# Patient Record
Sex: Female | Born: 1990 | Race: White | Hispanic: No | Smoking: Never smoker
Health system: Southern US, Community
[De-identification: ages and names within clinical notes are randomized; demographics above are authoritative.]

## PROBLEM LIST (undated history)

## (undated) ENCOUNTER — Inpatient Hospital Stay (HOSPITAL_COMMUNITY): Payer: Self-pay

## (undated) ENCOUNTER — Emergency Department (HOSPITAL_COMMUNITY): Admission: EM | Payer: Medicaid Other | Source: Home / Self Care

## (undated) DIAGNOSIS — I10 Essential (primary) hypertension: Secondary | ICD-10-CM

## (undated) DIAGNOSIS — F53 Postpartum depression: Secondary | ICD-10-CM

## (undated) DIAGNOSIS — Z9889 Other specified postprocedural states: Secondary | ICD-10-CM

## (undated) DIAGNOSIS — R51 Headache: Secondary | ICD-10-CM

## (undated) DIAGNOSIS — N390 Urinary tract infection, site not specified: Secondary | ICD-10-CM

## (undated) DIAGNOSIS — O99345 Other mental disorders complicating the puerperium: Secondary | ICD-10-CM

## (undated) DIAGNOSIS — L309 Dermatitis, unspecified: Secondary | ICD-10-CM

## (undated) DIAGNOSIS — R87629 Unspecified abnormal cytological findings in specimens from vagina: Secondary | ICD-10-CM

## (undated) HISTORY — PX: NO PAST SURGERIES: SHX2092

## (undated) HISTORY — DX: Unspecified abnormal cytological findings in specimens from vagina: R87.629

## (undated) HISTORY — DX: Other mental disorders complicating the puerperium: O99.345

## (undated) HISTORY — DX: Essential (primary) hypertension: I10

## (undated) HISTORY — DX: Postpartum depression: F53.0

---
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
## 2013-03-02 ENCOUNTER — Other Ambulatory Visit: Payer: Self-pay | Admitting: Advanced Practice Midwife

## 2014-01-28 ENCOUNTER — Encounter (HOSPITAL_COMMUNITY): Payer: Self-pay | Admitting: *Deleted

## 2016-03-29 NOTE — L&D Delivery Note (Signed)
Delivery Note Progressed quickly to complete dilation.  Pushed well  At 9:20 PM a viable and healthy female was delivered via Vaginal, Spontaneous (Presentation: OA  ).  APGAR: , ; weight  .   Placenta status: spontaneous and grossly intact with 3 vessel Cord:  with the following complications: none  Anesthesia:  epidural Episiotomy: None Lacerations:  none Suture Repair: none Est. Blood Loss (mL):  100  Mom to postpartum.  Baby to Couplet care / Skin to Skin.  Wynelle BourgeoisMarie Williams 02/12/2017, 10:45 PM  Please schedule this patient for PP visit in: 4 weeks High risk pregnancy complicated by: ? hypertension vs error with home cuff, also baby had bilateral choroid plexus cysts, GBS + Delivery mode:  SVD Anticipated Birth Control:  Nexplanon PP Procedures needed: none  Schedule Integrated BH visit: no Provider: Any provider

## 2016-07-03 ENCOUNTER — Other Ambulatory Visit (HOSPITAL_COMMUNITY): Payer: Self-pay | Admitting: Lab

## 2016-07-03 ENCOUNTER — Other Ambulatory Visit (HOSPITAL_COMMUNITY): Payer: Self-pay | Admitting: Obstetrics & Gynecology

## 2016-07-03 ENCOUNTER — Other Ambulatory Visit (HOSPITAL_COMMUNITY)
Admission: RE | Admit: 2016-07-03 | Discharge: 2016-07-03 | Disposition: A | Discharge status: Home / Self Care | Payer: Medicaid Other | Source: Ambulatory Visit | Attending: Obstetrics & Gynecology | Admitting: Obstetrics & Gynecology

## 2016-07-03 DIAGNOSIS — Z3490 Encounter for supervision of normal pregnancy, unspecified, unspecified trimester: Secondary | ICD-10-CM | POA: Diagnosis not present

## 2016-07-03 DIAGNOSIS — O3680X0 Pregnancy with inconclusive fetal viability, not applicable or unspecified: Secondary | ICD-10-CM

## 2016-07-03 LAB — HCG, QUANTITATIVE, PREGNANCY: hCG, Beta Chain, Quant, S: 2053 m[IU]/mL — ABNORMAL HIGH (ref <5)

## 2016-07-23 LAB — OB RESULTS CONSOLE HEPATITIS B SURFACE ANTIGEN: Hepatitis B Surface Ag: NEGATIVE

## 2016-07-23 LAB — OB RESULTS CONSOLE GBS: GBS: POSITIVE

## 2016-07-23 LAB — OB RESULTS CONSOLE RUBELLA ANTIBODY, IGM: Rubella: IMMUNE

## 2016-07-23 LAB — OB RESULTS CONSOLE GC/CHLAMYDIA
Chlamydia: NEGATIVE
Gonorrhea: NEGATIVE

## 2016-07-23 LAB — OB RESULTS CONSOLE HIV ANTIBODY (ROUTINE TESTING): HIV: NONREACTIVE

## 2016-07-23 LAB — OB RESULTS CONSOLE RPR: RPR: NONREACTIVE

## 2016-09-22 ENCOUNTER — Ambulatory Visit (INDEPENDENT_AMBULATORY_CARE_PROVIDER_SITE_OTHER): Payer: 59 | Admitting: Advanced Practice Midwife

## 2016-09-22 ENCOUNTER — Telehealth: Payer: Self-pay | Admitting: Obstetrics & Gynecology

## 2016-09-22 ENCOUNTER — Encounter: Payer: Self-pay | Admitting: *Deleted

## 2016-09-22 ENCOUNTER — Encounter (INDEPENDENT_AMBULATORY_CARE_PROVIDER_SITE_OTHER): Payer: Self-pay

## 2016-09-22 ENCOUNTER — Encounter: Payer: Self-pay | Admitting: Advanced Practice Midwife

## 2016-09-22 VITALS — BP 118/78 | HR 82 | Wt 231.5 lb

## 2016-09-22 DIAGNOSIS — Z8659 Personal history of other mental and behavioral disorders: Secondary | ICD-10-CM | POA: Insufficient documentation

## 2016-09-22 DIAGNOSIS — Z3482 Encounter for supervision of other normal pregnancy, second trimester: Secondary | ICD-10-CM

## 2016-09-22 DIAGNOSIS — Z1389 Encounter for screening for other disorder: Secondary | ICD-10-CM

## 2016-09-22 DIAGNOSIS — Z363 Encounter for antenatal screening for malformations: Secondary | ICD-10-CM

## 2016-09-22 DIAGNOSIS — Z331 Pregnant state, incidental: Secondary | ICD-10-CM

## 2016-09-22 DIAGNOSIS — Z3490 Encounter for supervision of normal pregnancy, unspecified, unspecified trimester: Secondary | ICD-10-CM | POA: Insufficient documentation

## 2016-09-22 DIAGNOSIS — Z8759 Personal history of other complications of pregnancy, childbirth and the puerperium: Principal | ICD-10-CM

## 2016-09-22 LAB — POCT URINALYSIS DIPSTICK
Blood, UA: NEGATIVE
Glucose, UA: NEGATIVE
Ketones, UA: NEGATIVE
Leukocytes, UA: NEGATIVE
Nitrite, UA: NEGATIVE
Protein, UA: NEGATIVE

## 2016-09-22 MED ORDER — ONDANSETRON HCL 4 MG PO TABS: 4.0000 mg | ORAL_TABLET | Freq: Three times a day (TID) | ORAL | 1 refills | Status: DC | PRN

## 2016-09-22 NOTE — Patient Instructions (Signed)
Subjective:        First Trimester of Pregnancy The first trimester of pregnancy is from week 1 until the end of week 12 (months 1 through 3). A week after a sperm fertilizes an egg, the egg will implant on the wall of the uterus. This embryo will begin to develop into a baby. Genes from you and your partner are forming the baby. The female genes determine whether the baby is a boy or a girl. At 6-8 weeks, the eyes and face are formed, and the heartbeat can be seen on ultrasound. At the end of 12 weeks, all the baby's organs are formed.  Now that you are pregnant, you will want to do everything you can to have a healthy baby. Two of the most important things are to get good prenatal care and to follow your health care provider's instructions. Prenatal care is all the medical care you receive before the baby's birth. This care will help prevent, find, and treat any problems during the pregnancy and childbirth. BODY CHANGES Your body goes through many changes during pregnancy. The changes vary from woman to woman.   You may gain or lose a couple of pounds at first.  You may feel sick to your stomach (nauseous) and throw up (vomit). If the vomiting is uncontrollable, call your health care provider.  You may tire easily.  You may develop headaches that can be relieved by medicines approved by your health care provider.  You may urinate more often. Painful urination may mean you have a bladder infection.  You may develop heartburn as a result of your pregnancy.  You may develop constipation because certain hormones are causing the muscles that push waste through your intestines to slow down.  You may develop hemorrhoids or swollen, bulging veins (varicose veins).  Your breasts may begin to grow larger and become tender. Your nipples may stick out more, and the tissue that surrounds them (areola) may become darker.  Your gums may bleed and may be sensitive to brushing and flossing.  Dark  spots or blotches (chloasma, mask of pregnancy) may develop on your face. This will likely fade after the baby is born.  Your menstrual periods will stop.  You may have a loss of appetite.  You may develop cravings for certain kinds of food.  You may have changes in your emotions from day to day, such as being excited to be pregnant or being concerned that something may go wrong with the pregnancy and baby.  You may have more vivid and strange dreams.  You may have changes in your hair. These can include thickening of your hair, rapid growth, and changes in texture. Some women also have hair loss during or after pregnancy, or hair that feels dry or thin. Your hair will most likely return to normal after your baby is born. WHAT TO EXPECT AT YOUR PRENATAL VISITS During a routine prenatal visit:  You will be weighed to make sure you and the baby are growing normally.  Your blood pressure will be taken.  Your abdomen will be measured to track your baby's growth.  The fetal heartbeat will be listened to starting around week 10 or 12 of your pregnancy.  Test results from any previous visits will be discussed. Your health care provider may ask you:  How you are feeling.  If you are feeling the baby move.  If you have had any abnormal symptoms, such as leaking fluid, bleeding, severe headaches, or abdominal cramping.  If you have any questions. Other tests that may be performed during your first trimester include:  Blood tests to find your blood type and to check for the presence of any previous infections. They will also be used to check for low iron levels (anemia) and Rh antibodies. Later in the pregnancy, blood tests for diabetes will be done along with other tests if problems develop.  Urine tests to check for infections, diabetes, or protein in the urine.  An ultrasound to confirm the proper growth and development of the baby.  An amniocentesis to check for possible genetic  problems.  Fetal screens for spina bifida and Down syndrome.  You may need other tests to make sure you and the baby are doing well. HOME CARE INSTRUCTIONS  Medicines  Follow your health care provider's instructions regarding medicine use. Specific medicines may be either safe or unsafe to take during pregnancy.  Take your prenatal vitamins as directed.  If you develop constipation, try taking a stool softener if your health care provider approves. Diet  Eat regular, well-balanced meals. Choose a variety of foods, such as meat or vegetable-based protein, fish, milk and low-fat dairy products, vegetables, fruits, and whole grain breads and cereals. Your health care provider will help you determine the amount of weight gain that is right for you.  Avoid raw meat and uncooked cheese. These carry germs that can cause birth defects in the baby.  Eating four or five small meals rather than three large meals a day may help relieve nausea and vomiting. If you start to feel nauseous, eating a few soda crackers can be helpful. Drinking liquids between meals instead of during meals also seems to help nausea and vomiting.  If you develop constipation, eat more high-fiber foods, such as fresh vegetables or fruit and whole grains. Drink enough fluids to keep your urine clear or pale yellow. Activity and Exercise  Exercise only as directed by your health care provider. Exercising will help you:  Control your weight.  Stay in shape.  Be prepared for labor and delivery.  Experiencing pain or cramping in the lower abdomen or low back is a good sign that you should stop exercising. Check with your health care provider before continuing normal exercises.  Try to avoid standing for long periods of time. Move your legs often if you must stand in one place for a long time.  Avoid heavy lifting.  Wear low-heeled shoes, and practice good posture.  You may continue to have sex unless your health care  provider directs you otherwise. Relief of Pain or Discomfort  Wear a good support bra for breast tenderness.   Take warm sitz baths to soothe any pain or discomfort caused by hemorrhoids. Use hemorrhoid cream if your health care provider approves.   Rest with your legs elevated if you have leg cramps or low back pain.  If you develop varicose veins in your legs, wear support hose. Elevate your feet for 15 minutes, 3-4 times a day. Limit salt in your diet. Prenatal Care  Schedule your prenatal visits by the twelfth week of pregnancy. They are usually scheduled monthly at first, then more often in the last 2 months before delivery.  Write down your questions. Take them to your prenatal visits.  Keep all your prenatal visits as directed by your health care provider. Safety  Wear your seat belt at all times when driving.  Make a list of emergency phone numbers, including numbers for family, friends, the hospital,  and police and fire departments. General Tips  Ask your health care provider for a referral to a local prenatal education class. Begin classes no later than at the beginning of month 6 of your pregnancy.  Ask for help if you have counseling or nutritional needs during pregnancy. Your health care provider can offer advice or refer you to specialists for help with various needs.  Do not use hot tubs, steam rooms, or saunas.  Do not douche or use tampons or scented sanitary pads.  Do not cross your legs for long periods of time.  Avoid cat litter boxes and soil used by cats. These carry germs that can cause birth defects in the baby and possibly loss of the fetus by miscarriage or stillbirth.  Avoid all smoking, herbs, alcohol, and medicines not prescribed by your health care provider. Chemicals in these affect the formation and growth of the baby.  Schedule a dentist appointment. At home, brush your teeth with a soft toothbrush and be gentle when you floss. SEEK MEDICAL  CARE IF:   You have dizziness.  You have mild pelvic cramps, pelvic pressure, or nagging pain in the abdominal area.  You have persistent nausea, vomiting, or diarrhea.  You have a bad smelling vaginal discharge.  You have pain with urination.  You notice increased swelling in your face, hands, legs, or ankles. SEEK IMMEDIATE MEDICAL CARE IF:   You have a fever.  You are leaking fluid from your vagina.  You have spotting or bleeding from your vagina.  You have severe abdominal cramping or pain.  You have rapid weight gain or loss.  You vomit blood or material that looks like coffee grounds.  You are exposed to [place] measles and have never had them.  You are exposed to fifth disease or chickenpox.  You develop a severe headache.  You have shortness of breath.  You have any kind of trauma, such as from a fall or a car accident. Document Released: 03/09/2001 Document Revised: 07/30/2013 Document Reviewed: 01/23/2013 [facility] Patient Information 2015 [place], [place]. This information is not intended to replace advice given to you by your health care provider. Make sure you discuss any questions you have with your health care provider.   Nausea & Vomiting  Have saltine crackers or pretzels by your bed and eat a few bites before you raise your head out of bed in the morning  Eat small frequent meals throughout the day instead of large meals  Drink plenty of fluids throughout the day to stay hydrated, just don't drink a lot of fluids with your meals.  This can make your stomach fill up faster making you feel sick  Do not brush your teeth right after you eat  Products with real ginger are good for nausea, like ginger ale and ginger hard candy Make sure it says made with real ginger!  Sucking on sour candy like lemon heads is also good for nausea  If your prenatal vitamins make you nauseated, take them at night so you will sleep through the nausea  Sea Bands  If you  feel like you need medicine for the nausea & vomiting please let us know  If you are unable to keep any fluids or food down please let us know   Constipation  Drink plenty of fluid, preferably water, throughout the day  Eat foods high in fiber such as fruits, vegetables, and grains  Exercise, such as walking, is a good way to keep your bowels regular  Drink warm fluids, especially warm prune juice, or decaf coffee  Eat a 1/2 cup of real oatmeal (not instant), 1/2 cup applesauce, and 1/2-1 cup warm prune juice every day  If needed, you may take Colace (docusate sodium) stool softener once or twice a day to help keep the stool soft. If you are pregnant, wait until you are out of your first trimester (12-14 weeks of pregnancy)  If you still are having problems with constipation, you may take Miralax once daily as needed to help keep your bowels regular.  If you are pregnant, wait until you are out of your first trimester (12-14 weeks of pregnancy)  Safe Medications in Pregnancy   Acne: Benzoyl Peroxide Salicylic Acid  Backache/Headache: Tylenol: 2 regular strength every 4 hours OR              2 Extra strength every 6 hours  Colds/Coughs/Allergies: Benadryl (alcohol free) 25 mg every 6 hours as needed Breath right strips Claritin Cepacol throat lozenges Chloraseptic throat spray Cold-Eeze- up to three times per day Cough drops, alcohol free Flonase (by prescription only) Guaifenesin Mucinex Robitussin DM (plain only, alcohol free) Saline nasal spray/drops Sudafed (pseudoephedrine) & Actifed ** use only after [redacted] weeks gestation and if you do not have high blood pressure Tylenol Vicks Vaporub Zinc lozenges Zyrtec   Constipation: Colace Ducolax suppositories Fleet enema Glycerin suppositories Metamucil Milk of magnesia Miralax Senokot Smooth move tea  Diarrhea: Kaopectate Imodium A-D  *NO pepto Bismol  Hemorrhoids: Anusol Anusol HC Preparation  H Tucks  Indigestion: Tums Maalox Mylanta Zantac  Pepcid  Insomnia: Benadryl (alcohol free)  every 6 hours as needed Tylenol PM Unisom, no Gelcaps  Leg Cramps: Tums MagGel  Nausea/Vomiting:  Bonine Dramamine Emetrol Ginger extract Sea bands Meclizine  Nausea medication to take during pregnancy:  Unisom (doxylamine succinate 25 mg tablets) Take one tablet daily at bedtime. If symptoms are not adequately controlled, the dose can be increased to a maximum recommended dose of two tablets daily (1/2 tablet in the morning, 1/2 tablet mid-afternoon and one at bedtime). Vitamin B6  tablets. Take one tablet twice a day (up to 200 mg per day).  Skin Rashes: Aveeno products Benadryl cream or  every 6 hours as needed Calamine Lotion 1% cortisone cream  Yeast infection: Gyne-lotrimin 7 Monistat 7   **If taking multiple medications, please check labels to avoid duplicating the same active ingredients **take medication as directed on the label ** Do not exceed 4000 mg of tylenol in 24 hours **Do not take medications that contain aspirin or ibuprofen

## 2016-09-22 NOTE — Telephone Encounter (Signed)
Patient called to schedule new ob appt with us today.

## 2016-09-22 NOTE — Progress Notes (Signed)
.    Subjective:    Shelly Hancock is a G2P1001 7217w4d being seen today for her first obstetrical visit.  [place] a [place] [employer] [place]/t having medicaid as her secondary insurance. Her obstetrical history is significant for Term SVD.  Pregnancy history fully reviewed.  Patient reports nausea.  Vitals:   09/22/16 1540  BP: 118/78  Pulse: 82  Weight: 231 lb 8 oz (105 kg)    HISTORY: OB History  Gravida Para Term Preterm AB Living  2 1 1  0 0 1  SAB TAB Ectopic Multiple Live Births  0 0 0 0 1    # Outcome Date GA Lbr Len/2nd Weight Sex Delivery Anes PTL Lv  2 Current           1 Term 11/02/11 8068w1d 14:15 / 01:00 7 lb 3.9 oz (3.286 kg) M Vag-Spont EPI  LIV     Past Medical History:  Diagnosis Date  . Eczema   . Headache(784.0)   . Hypertension   . Postpartum depression   . Urinary tract infection   . Vaginal Pap smear, abnormal    Past Surgical History:  Procedure Laterality Date  . NO PAST SURGERIES     Family History  Problem Relation Age of Onset  . Heart attack Paternal Grandfather   . Diabetes Paternal Grandmother   . Cancer Maternal Grandmother        colon  . Heart attack Father   . Hypertension Mother   . Cancer Mother        uterine cancer  . Cancer Maternal Aunt        skin  . Cancer Maternal Uncle        lung cancer  . Other Neg Hx      Exam                                      System:     Skin: normal coloration and turgor, no rashes    Neurologic: oriented, normal, normal mood   Extremities: normal strength, tone, and muscle mass   HEENT PERRLA   Mouth/Teeth mucous membranes moist, normal dentition   Neck supple and no masses   Cardiovascular: regular rate and rhythm   Respiratory:  appears well, vitals normal, no respiratory distress, acyanotic   Abdomen: soft, non-tender;  FHR: 157          Assessment:    Pregnancy: G2P1001 Patient Active Problem List   Diagnosis Date Noted  . History of postpartum depression  09/22/2016  . Supervision of normal pregnancy 09/22/2016        Plan:    . Continue prenatal vitamins  Problem list reviewed and updated  rx zofran  Reviewed recommended weight gain based on pre-gravid BMI  Encouraged well-balanced diet Genetic Screening discussed Integrated Screen: results reviewed.  Ultrasound discussed; fetal survey: requested.  Return in about 3 weeks (around 10/13/2016) for LROB, ZO:[id number]:Anatomy.  CRESENZO-DISHMAN,Glorya Bartley 09/23/2016

## 2016-10-15 ENCOUNTER — Ambulatory Visit (INDEPENDENT_AMBULATORY_CARE_PROVIDER_SITE_OTHER): Payer: 59

## 2016-10-15 ENCOUNTER — Ambulatory Visit (INDEPENDENT_AMBULATORY_CARE_PROVIDER_SITE_OTHER): Payer: 59 | Admitting: Women's Health

## 2016-10-15 ENCOUNTER — Encounter: Payer: Self-pay | Admitting: Women's Health

## 2016-10-15 ENCOUNTER — Other Ambulatory Visit: Payer: Self-pay | Admitting: Women's Health

## 2016-10-15 VITALS — BP 118/62 | HR 85 | Wt 231.0 lb

## 2016-10-15 DIAGNOSIS — IMO0002 Reserved for concepts with insufficient information to code with codable children: Secondary | ICD-10-CM | POA: Insufficient documentation

## 2016-10-15 DIAGNOSIS — Z363 Encounter for antenatal screening for malformations: Secondary | ICD-10-CM

## 2016-10-15 DIAGNOSIS — Z3A19 19 weeks gestation of pregnancy: Secondary | ICD-10-CM

## 2016-10-15 DIAGNOSIS — O09892 Supervision of other high risk pregnancies, second trimester: Secondary | ICD-10-CM

## 2016-10-15 DIAGNOSIS — Z3482 Encounter for supervision of other normal pregnancy, second trimester: Secondary | ICD-10-CM

## 2016-10-15 DIAGNOSIS — R8271 Bacteriuria: Secondary | ICD-10-CM

## 2016-10-15 DIAGNOSIS — O350XX0 Maternal care for (suspected) central nervous system malformation in fetus, not applicable or unspecified: Secondary | ICD-10-CM

## 2016-10-15 DIAGNOSIS — Z1389 Encounter for screening for other disorder: Secondary | ICD-10-CM

## 2016-10-15 DIAGNOSIS — IMO0001 Reserved for inherently not codable concepts without codable children: Secondary | ICD-10-CM

## 2016-10-15 DIAGNOSIS — Z3402 Encounter for supervision of normal first pregnancy, second trimester: Secondary | ICD-10-CM

## 2016-10-15 DIAGNOSIS — Z331 Pregnant state, incidental: Secondary | ICD-10-CM

## 2016-10-15 LAB — POCT URINALYSIS DIPSTICK
Blood, UA: NEGATIVE
Glucose, UA: NEGATIVE
Ketones, UA: NEGATIVE
Nitrite, UA: NEGATIVE
Protein, UA: NEGATIVE

## 2016-10-15 NOTE — Progress Notes (Signed)
Low-risk OB appointment G2P1001 2261w6d Estimated Date of Delivery: 03/05/17 BP 118/62   Pulse 85   Wt 231 lb (104.8 kg)   LMP 05/29/2016   BMI 36.18 kg/m   BP, weight, and urine reviewed.  Refer to obstetrical flow sheet for FH & FHR.  Reports good fm.  Denies regular uc's, lof, vb, or uti s/s. No complaints.  Reviewed today's anatomy u/s, bilateral cp cysts, otherwise normal. Had 1st IT/NT at [facility] prior to transferring here, never had 2nd IT, unsure of which lab they use w/ [facility] to get 2nd IT today-discussed w/ [facility], will get AFP today if pt desires, which she does.  Plan:  Continue routine obstetrical care  F/U in 4wks for OB appointment  AFP, urine cx poc today

## 2016-10-15 NOTE — Patient Instructions (Signed)

## 2016-10-15 NOTE — Progress Notes (Signed)
US 19+6 wks,cephalic,cx 5 cm,normal ovaries bilat,post pl gr 0,svp of fluid 4.5 cm,bilat choroid plexus cysts,left 4.6 x 2.7 x 3.4 mm,right 4.4 x 3.3 x 4.6 mm,fhr 157 bpm,EFW 342 g,anatomy complete

## 2016-10-16 LAB — AFP TUMOR MARKER: AFP, Serum, Tumor Marker: 62.6 ng/mL — ABNORMAL HIGH (ref 0.0–8.3)

## 2016-10-17 LAB — URINE CULTURE

## 2016-10-21 ENCOUNTER — Other Ambulatory Visit: Payer: Self-pay | Admitting: Women's Health

## 2016-10-21 ENCOUNTER — Encounter: Payer: Self-pay | Admitting: Women's Health

## 2016-10-21 ENCOUNTER — Telehealth: Payer: Self-pay | Admitting: *Deleted

## 2016-10-21 MED ORDER — AMOXICILLIN 500 MG PO CAPS: 500.0000 mg | ORAL_CAPSULE | Freq: Two times a day (BID) | ORAL | 0 refills | Status: DC

## 2016-10-21 NOTE — Telephone Encounter (Signed)
Informed patient GBS still positive and prescription for antibiotic was sent to pharmacy. Advised to take all as directed. Verbalized understanding.

## 2016-10-26 ENCOUNTER — Encounter: Payer: Self-pay | Admitting: Women's Health

## 2016-11-01 ENCOUNTER — Encounter: Payer: Self-pay | Admitting: Women's Health

## 2016-11-05 ENCOUNTER — Telehealth: Payer: Self-pay | Admitting: *Deleted

## 2016-11-05 NOTE — Telephone Encounter (Signed)
Informed patient that AFP test was not recalculated and can only have AFP only for open spina bifida drawn before 23.6. States she lives 30 minutes away but will have drawn at that visit.

## 2016-11-08 ENCOUNTER — Other Ambulatory Visit: Payer: Self-pay | Admitting: Adult Health

## 2016-11-08 ENCOUNTER — Encounter: Payer: Self-pay | Admitting: Women's Health

## 2016-11-08 MED ORDER — ONDANSETRON HCL 4 MG PO TABS: ORAL_TABLET | ORAL | 1 refills | Status: DC

## 2016-11-08 NOTE — Progress Notes (Signed)
Refilled zofan

## 2016-11-09 ENCOUNTER — Encounter: Payer: Self-pay | Admitting: Women's Health

## 2016-11-10 ENCOUNTER — Encounter: Payer: Self-pay | Admitting: Women's Health

## 2016-11-12 ENCOUNTER — Encounter: Payer: Self-pay | Admitting: Obstetrics & Gynecology

## 2016-11-12 ENCOUNTER — Ambulatory Visit (INDEPENDENT_AMBULATORY_CARE_PROVIDER_SITE_OTHER): Payer: 59 | Admitting: Obstetrics & Gynecology

## 2016-11-12 VITALS — BP 130/70 | HR 86 | Wt 233.0 lb

## 2016-11-12 DIAGNOSIS — O350XX0 Maternal care for (suspected) central nervous system malformation in fetus, not applicable or unspecified: Secondary | ICD-10-CM

## 2016-11-12 DIAGNOSIS — Z3A23 23 weeks gestation of pregnancy: Secondary | ICD-10-CM

## 2016-11-12 DIAGNOSIS — Z331 Pregnant state, incidental: Secondary | ICD-10-CM

## 2016-11-12 DIAGNOSIS — Z3482 Encounter for supervision of other normal pregnancy, second trimester: Secondary | ICD-10-CM

## 2016-11-12 DIAGNOSIS — G930 Cerebral cysts: Secondary | ICD-10-CM

## 2016-11-12 DIAGNOSIS — Z1389 Encounter for screening for other disorder: Secondary | ICD-10-CM

## 2016-11-12 LAB — POCT URINALYSIS DIPSTICK
Blood, UA: NEGATIVE
Glucose, UA: NEGATIVE
Leukocytes, UA: NEGATIVE
Nitrite, UA: NEGATIVE

## 2016-11-12 MED ORDER — OMEPRAZOLE 20 MG PO CPDR: DELAYED_RELEASE_CAPSULE | ORAL | 6 refills | Status: DC

## 2016-11-12 NOTE — Progress Notes (Signed)
G2P1001 [redacted]w[redacted]d Estimated Date of Delivery: 03/05/17  Blood pressure 130/70, pulse 86, weight 233 lb (105.7 kg), last menstrual period 05/29/2016, unknown if currently breastfeeding.   BP weight and urine results all reviewed and noted.  Please refer to the obstetrical flow sheet for the fundal height and fetal heart rate documentation:  Patient reports good fetal movement, denies any bleeding and no rupture of membranes symptoms or regular contractions. Patient is without complaints. All questions were answered.  Orders Placed This Encounter  Procedures  . US OB Limited  . POCT urinalysis dipstick    Plan:  Continued routine obstetrical care, PN2 + sonogram next visit, recheck CP cysts  Return in about 4 weeks (around 12/10/2016) for repeat sonogram, PN2,, LROB.

## 2016-11-12 NOTE — Addendum Note (Signed)
Addended by: Lazaro Arms on: 11/12/2016 10:36 AM   Modules accepted: Orders

## 2016-11-15 ENCOUNTER — Encounter: Payer: Self-pay | Admitting: Women's Health

## 2016-11-15 ENCOUNTER — Telehealth: Payer: Self-pay | Admitting: *Deleted

## 2016-11-15 ENCOUNTER — Telehealth: Payer: Self-pay | Admitting: Women's Health

## 2016-11-15 DIAGNOSIS — Z3482 Encounter for supervision of other normal pregnancy, second trimester: Secondary | ICD-10-CM

## 2016-11-15 DIAGNOSIS — Z029 Encounter for administrative examinations, unspecified: Secondary | ICD-10-CM

## 2016-11-15 DIAGNOSIS — IMO0001 Reserved for inherently not codable concepts without codable children: Secondary | ICD-10-CM

## 2016-11-15 NOTE — Telephone Encounter (Signed)
Called pt, have been e-mailing through [employer], but easier to talk on phone to discuss. AFP tumor marker was drawn instead of AFP @ 20wks. Tish had called [facility] and gotten them to switch it, however about 2-3wks later we still didn't have results, so she called [facility] again and they said they had no record of her calling, and it was never switched to regular AFP. Discussed that the CP cysts on u/s can sometimes be seen w/ T18, however all other anatomy was normal. Offered Informaseq, pt declines any further genetic testing at this time.  She reports she is concerned about her bp. Her insurance company sent her a bp cuff, so she has been checking at home. Has been getting some elevated readings. Got 140/110 yesterday, however today was normal today when she checked it. Some dizziness. No headaches, visual changes, ruq/epigastric pain. States she feels completely fine today. BP was 130/70 here at her appt on Friday. Discussed she needs to be seen, she can not get here today. Switched to front to work-in this week for bp check. She is to bring her cuff with her so we can make sure it is accurate. Discussed pre-e s/s, reasons to seek care.  Cheral Marker, CNM, [facility] 11/15/2016 3:08 PM

## 2016-11-16 ENCOUNTER — Encounter: Payer: Self-pay | Admitting: Women's Health

## 2016-11-18 ENCOUNTER — Encounter: Payer: Self-pay | Admitting: Women's Health

## 2016-11-18 ENCOUNTER — Ambulatory Visit (INDEPENDENT_AMBULATORY_CARE_PROVIDER_SITE_OTHER): Payer: 59 | Admitting: Women's Health

## 2016-11-18 VITALS — BP 131/70 | HR 106 | Wt 232.0 lb

## 2016-11-18 DIAGNOSIS — Z3A24 24 weeks gestation of pregnancy: Secondary | ICD-10-CM

## 2016-11-18 DIAGNOSIS — O350XX0 Maternal care for (suspected) central nervous system malformation in fetus, not applicable or unspecified: Secondary | ICD-10-CM

## 2016-11-18 DIAGNOSIS — O132 Gestational [pregnancy-induced] hypertension without significant proteinuria, second trimester: Secondary | ICD-10-CM

## 2016-11-18 DIAGNOSIS — IMO0001 Reserved for inherently not codable concepts without codable children: Secondary | ICD-10-CM

## 2016-11-18 DIAGNOSIS — Z331 Pregnant state, incidental: Secondary | ICD-10-CM

## 2016-11-18 DIAGNOSIS — O0992 Supervision of high risk pregnancy, unspecified, second trimester: Secondary | ICD-10-CM

## 2016-11-18 DIAGNOSIS — O09892 Supervision of other high risk pregnancies, second trimester: Secondary | ICD-10-CM

## 2016-11-18 DIAGNOSIS — Z1389 Encounter for screening for other disorder: Secondary | ICD-10-CM

## 2016-11-18 DIAGNOSIS — R030 Elevated blood-pressure reading, without diagnosis of hypertension: Secondary | ICD-10-CM | POA: Insufficient documentation

## 2016-11-18 LAB — POCT URINALYSIS DIPSTICK
Blood, UA: NEGATIVE
Glucose, UA: NEGATIVE
Nitrite, UA: NEGATIVE
Protein, UA: NEGATIVE

## 2016-11-18 NOTE — Patient Instructions (Signed)
Continue checking blood pressure 4 times daily at home, if top consistently >160 or bottom >110 go straight to [facility] for evaluation  You will have your sugar test next visit.  Please do not eat or drink anything after midnight the night before you come, not even water.  You will be here for at least two hours.     Call the office [phone]) or go to [facility] if:  You begin to have strong, frequent contractions  Your water breaks.  Sometimes it is a big gush of fluid, sometimes it is just a trickle that keeps getting your panties wet or running down your legs  You have vaginal bleeding.  It is normal to have a small amount of spotting if your cervix was checked.   You don't feel your baby moving like normal.  If you don't, get you something to eat and drink and lay down and focus on feeling your baby move.   If your baby is still not moving like normal, you should call the office or go to [facility].  Call the office [phone]) or go to [facility] hospital for these signs of pre-eclampsia:  Severe headache that does not go away with Tylenol  Visual changes- seeing spots, double, blurred vision  Pain under your right breast or upper abdomen that does not go away with Tums or heartburn medicine  Nausea and/or vomiting  Severe swelling in your hands, feet, and face   For Dizzy Spells:   This is usually related to either your blood sugar or your blood pressure dropping  Make sure you are staying well hydrated and drinking enough water so that your urine is clear  Eat small frequent meals and snacks containing protein (meat, eggs, nuts, cheese) so that your blood sugar doesn't drop  If you do get dizzy, sit/lay down and get you something to drink and a snack containing protein- you will usually start feeling better in 10-20 minutes     Hypertension During Pregnancy Hypertension, commonly called high blood pressure, is when the force of blood pumping through your  arteries is too strong. Arteries are blood vessels that carry blood from the heart throughout the body. Hypertension during pregnancy can cause problems for you and your baby. Your baby may be born early (prematurely) or may not weigh as much as he or she should at birth. Very bad cases of hypertension during pregnancy can be life-threatening. Different types of hypertension can occur during pregnancy. These include:  Chronic hypertension. This happens when: ? You have hypertension before pregnancy and it continues during pregnancy. ? You develop hypertension before you are [redacted] weeks pregnant, and it continues during pregnancy.  Gestational hypertension. This is hypertension that develops after the 20th week of pregnancy.  Preeclampsia, also called toxemia of pregnancy. This is a very serious type of hypertension that develops only during pregnancy. It affects the whole body, and it can be very dangerous for you and your baby.  Gestational hypertension and preeclampsia usually go away within 6 weeks after your baby is born. Women who have hypertension during pregnancy have a greater chance of developing hypertension later in life or during future pregnancies. What are the causes? The exact cause of hypertension is not known. What increases the risk? There are certain factors that make it more likely for you to develop hypertension during pregnancy. These include:  Having hypertension during a previous pregnancy or prior to pregnancy.  Being overweight.  Being older than age 53.  Being pregnant for the first time or being pregnant with more than one baby.  Becoming pregnant using fertilization methods such as IVF (in vitro fertilization).  Having diabetes, kidney problems, or systemic lupus erythematosus.  Having a family history of hypertension.  What are the signs or symptoms? Chronic hypertension and gestational hypertension rarely cause symptoms. Preeclampsia causes symptoms, which  may include:  Increased protein in your urine. Your health care provider will check for this at every visit before you give birth (prenatal visit).  Severe headaches.  Sudden weight gain.  Swelling of the hands, face, legs, and feet.  Nausea and vomiting.  Vision problems, such as blurred or double vision.  Numbness in the face, arms, legs, and feet.  Dizziness.  Slurred speech.  Sensitivity to bright lights.  Abdominal pain.  Convulsions.  How is this diagnosed? You may be diagnosed with hypertension during a routine prenatal exam. At each prenatal visit, you may:  Have a urine test to check for high amounts of protein in your urine.  Have your blood pressure checked. A blood pressure reading is recorded as two numbers, such as "120 over 80" (or 120/80). The first ("top") number is called the systolic pressure. It is a measure of the pressure in your arteries when your heart beats. The second ("bottom") number is called the diastolic pressure. It is a measure of the pressure in your arteries as your heart relaxes between beats. Blood pressure is measured in a unit called mm Hg. A normal blood pressure reading is: ? Systolic: below 120. ? Diastolic: below 80.  The type of hypertension that you are diagnosed with depends on your test results and when your symptoms developed.  Chronic hypertension is usually diagnosed before 20 weeks of pregnancy.  Gestational hypertension is usually diagnosed after 20 weeks of pregnancy.  Hypertension with high amounts of protein in the urine is diagnosed as preeclampsia.  Blood pressure measurements that stay above 160 systolic, or above 110 diastolic, are signs of severe preeclampsia.  How is this treated? Treatment for hypertension during pregnancy varies depending on the type of hypertension you have and how serious it is.  If you take medicines called ACE inhibitors to treat chronic hypertension, you may need to switch medicines.  ACE inhibitors should not be taken during pregnancy.  If you have gestational hypertension, you may need to take blood pressure medicine.  If you are at risk for preeclampsia, your health care provider may recommend that you take a low-dose aspirin every day to prevent high blood pressure during your pregnancy.  If you have severe preeclampsia, you may need to be hospitalized so you and your baby can be monitored closely. You may also need to take medicine (magnesium sulfate) to prevent seizures and to lower blood pressure. This medicine may be given as an injection or through an IV tube.  In some cases, if your condition gets worse, you may need to deliver your baby early.  Follow these instructions at home: Eating and drinking  Drink enough fluid to keep your urine clear or pale yellow.  Eat a healthy diet that is low in salt (sodium). Do not add salt to your food. Check food labels to see how much sodium a food or beverage contains. Lifestyle  Do not use any products that contain nicotine or tobacco, such as cigarettes and e-cigarettes. If you need help quitting, ask your health care provider.  Do not use alcohol.  Avoid caffeine.  Avoid stress as much  as possible. Rest and get plenty of sleep. General instructions  Take over-the-counter and prescription medicines only as told by your health care provider.  While lying down, lie on your left side. This keeps pressure off your baby.  While sitting or lying down, raise (elevate) your feet. Try putting some pillows under your lower legs.  Exercise regularly. Ask your health care provider what kinds of exercise are best for you.  Keep all prenatal and follow-up visits as told by your health care provider. This is important. Contact a health care provider if:  You have symptoms that your health care provider told you may require more treatment or monitoring, such as: ? Fever. ? Vomiting. ? Headache. Get help right away  if:  You have severe abdominal pain or vomiting that does not get better with treatment.  You suddenly develop swelling in your hands, ankles, or face.  You gain 4 lbs (1.8 kg) or more in 1 week.  You develop vaginal bleeding, or you have blood in your urine.  You do not feel your baby moving as much as usual.  You have blurred or double vision.  You have muscle twitching or sudden tightening (spasms).  You have shortness of breath.  Your lips or fingernails turn blue. This information is not intended to replace advice given to you by your health care provider. Make sure you discuss any questions you have with your health care provider. Document Released: 12/01/2010 Document Revised: 10/03/2015 Document Reviewed: 08/29/2015 Elsevier Interactive Patient Education  [employer].

## 2016-11-18 NOTE — Progress Notes (Signed)
High Risk Pregnancy Diagnosis(es): GHTN dx today G2P1001 [redacted]w[redacted]d Estimated Date of Delivery: 03/05/17 BP 131/70   Pulse (!) 106   Wt 232 lb (105.2 kg)   LMP 05/29/2016   BMI 36.34 kg/m   Urinalysis: Positive for lg ketones, mod leuks HPI:  Pt has been [employer] via [employer]. States her insurance company sent her a bp cuff, she has been checking bp's QID and is getting a lot of elevated numbers. Denies h/o HTN.  11/15/16: 140-141/74-85 11/16/16: 127-149/79-114 (only 1 normal SBP, 2 normal DBP) 11/17/16:134-150/74-120 (only 1 normal SBP, 2 normal DBP)  150/120 was at 0430 when she woke up feeling dizzy/shaky/sick-rechecked shortly after and was better  All BP's here have been normal, including today- pt brought her bp cuff w/ her as directed-so we could make sure was accurate Her cuff: 131/70 Our manual cuff: 132/74  Occ headaches, doesn't take apap, eventually goes away,no ha right now Occ sees dark spots 'like dust falling' usually when feeling dizzy Denies ruq/epigastric pain, n/v Has been having dizzy episodes, states she is drinking 15 cups of water daily  BP, weight, and urine reviewed.  Reports still active fm, but slightly decreased last 2 days. Denies regular uc's, lof, vb, uti s/s.   Fundal Height:  26 Fetal Heart rate:  150, reassuring FHR on EFM for GA, mod variability/no decels Edema:  None DTRs 2+, no clonus  Discussed w/ LHE, states to tx as GHTN based on home bp's.  Reviewed ptl s/s, fm, pre-e s/s. Continue checking bp's QID at home, bring log to appts. Gave parameters to go to [facility] (SBP consistently >160 or DBP >110) Gave printed dizzy spell prevention/relief info  All questions were answered Assessment: [redacted]w[redacted]d GHTN, dx today Medication(s) Plans:  None right now Treatment Plan:  Pre-e labs today, Growth u/s q 3-4wks     Weekly BPPs @ 28wks     2x/wk testing nst alt w/ bpp/dopp @ 32wks        IOL @ 37wks Follow up in 1wk for high-risk OB appt and efw u/s

## 2016-11-19 LAB — COMPREHENSIVE METABOLIC PANEL
ALT: 27 IU/L (ref 0–32)
AST: 23 IU/L (ref 0–40)
Albumin/Globulin Ratio: 1.6 (ref 1.2–2.2)
Albumin: 3.7 g/dL (ref 3.5–5.5)
Alkaline Phosphatase: 85 IU/L (ref 39–117)
BUN/Creatinine Ratio: 16 (ref 9–23)
BUN: 7 mg/dL (ref 6–20)
Bilirubin Total: 0.3 mg/dL (ref 0.0–1.2)
CO2: 19 mmol/L — ABNORMAL LOW (ref 20–29)
Calcium: 9 mg/dL (ref 8.7–10.2)
Chloride: 104 mmol/L (ref 96–106)
Creatinine, Ser: 0.45 mg/dL — ABNORMAL LOW (ref 0.57–1.00)
GFR calc Af Amer: 163 mL/min/{1.73_m2} (ref >59)
GFR calc non Af Amer: 142 mL/min/{1.73_m2} (ref >59)
Globulin, Total: 2.3 g/dL (ref 1.5–4.5)
Glucose: 88 mg/dL (ref 65–99)
Potassium: 3.9 mmol/L (ref 3.5–5.2)
Sodium: 138 mmol/L (ref 134–144)
Total Protein: 6 g/dL (ref 6.0–8.5)

## 2016-11-19 LAB — CBC
Hematocrit: 33.4 % — ABNORMAL LOW (ref 34.0–46.6)
Hemoglobin: 11.2 g/dL (ref 11.1–15.9)
MCH: 30.9 pg (ref 26.6–33.0)
MCHC: 33.5 g/dL (ref 31.5–35.7)
MCV: 92 fL (ref 79–97)
Platelets: 227 10*3/uL (ref 150–379)
RBC: 3.62 x10E6/uL — ABNORMAL LOW (ref 3.77–5.28)
RDW: 13.4 % (ref 12.3–15.4)
WBC: 9.3 10*3/uL (ref 3.4–10.8)

## 2016-11-19 LAB — PROTEIN / CREATININE RATIO, URINE
Creatinine, Urine: 192.7 mg/dL
Protein, Ur: 59.4 mg/dL
Protein/Creat Ratio: 308 mg/g creat — ABNORMAL HIGH (ref 0–200)

## 2016-11-23 ENCOUNTER — Encounter: Payer: Self-pay | Admitting: Women's Health

## 2016-11-24 ENCOUNTER — Encounter: Payer: 59 | Admitting: Obstetrics and Gynecology

## 2016-11-25 ENCOUNTER — Ambulatory Visit (INDEPENDENT_AMBULATORY_CARE_PROVIDER_SITE_OTHER): Payer: 59

## 2016-11-25 ENCOUNTER — Encounter: Payer: Self-pay | Admitting: Obstetrics & Gynecology

## 2016-11-25 ENCOUNTER — Encounter: Payer: Self-pay | Admitting: Women's Health

## 2016-11-25 ENCOUNTER — Ambulatory Visit (INDEPENDENT_AMBULATORY_CARE_PROVIDER_SITE_OTHER): Payer: 59 | Admitting: Obstetrics & Gynecology

## 2016-11-25 VITALS — BP 122/80 | HR 82 | Wt 233.0 lb

## 2016-11-25 DIAGNOSIS — O132 Gestational [pregnancy-induced] hypertension without significant proteinuria, second trimester: Secondary | ICD-10-CM | POA: Diagnosis not present

## 2016-11-25 DIAGNOSIS — O0992 Supervision of high risk pregnancy, unspecified, second trimester: Secondary | ICD-10-CM

## 2016-11-25 DIAGNOSIS — Z331 Pregnant state, incidental: Secondary | ICD-10-CM

## 2016-11-25 DIAGNOSIS — Z1389 Encounter for screening for other disorder: Secondary | ICD-10-CM

## 2016-11-25 DIAGNOSIS — Z3492 Encounter for supervision of normal pregnancy, unspecified, second trimester: Secondary | ICD-10-CM

## 2016-11-25 DIAGNOSIS — Z3A25 25 weeks gestation of pregnancy: Secondary | ICD-10-CM

## 2016-11-25 DIAGNOSIS — O350XX0 Maternal care for (suspected) central nervous system malformation in fetus, not applicable or unspecified: Secondary | ICD-10-CM | POA: Diagnosis not present

## 2016-11-25 DIAGNOSIS — IMO0001 Reserved for inherently not codable concepts without codable children: Secondary | ICD-10-CM

## 2016-11-25 DIAGNOSIS — O0990 Supervision of high risk pregnancy, unspecified, unspecified trimester: Secondary | ICD-10-CM

## 2016-11-25 LAB — POCT URINALYSIS DIPSTICK
Blood, UA: NEGATIVE
Glucose, UA: NEGATIVE
Ketones, UA: NEGATIVE
Leukocytes, UA: NEGATIVE
Nitrite, UA: NEGATIVE
Protein, UA: NEGATIVE

## 2016-11-25 NOTE — Progress Notes (Signed)
G2P1001 8240w5d Estimated Date of Delivery: 03/05/17  Blood pressure 122/80, pulse 82, weight 233 lb (105.7 kg), last menstrual period 05/29/2016, unknown if currently breastfeeding.   BP weight and urine results all reviewed and noted.  Please refer to the obstetrical flow sheet for the fundal height and fetal heart rate documentation:  Patient reports good fetal movement, denies any bleeding and no rupture of membranes symptoms or regular contractions. Patient is without complaints. All questions were answered.  Orders Placed This Encounter  Procedures  . POCT urinalysis dipstick    Plan:  Continued routine obstetrical care, After looking at her BP extensively I believe her cuff is bad Recommended getting another one and can take with the new one, this one is not reliable Her BP here continue to be good, so I am undoing her pre eclampsia diagnosis, for now  No Follow-up on file.

## 2016-11-25 NOTE — Progress Notes (Signed)
US 25+5 wks,breech,cx 4.2 cm,bilat adnexa's wnl,resolved choroid plexus cysts,post pl gr 0,svp of fluid 5.7 cm,fhr 133 bpm,EFW 897 g 55.9%

## 2016-12-07 ENCOUNTER — Encounter (HOSPITAL_COMMUNITY): Payer: Self-pay | Admitting: *Deleted

## 2016-12-07 ENCOUNTER — Telehealth: Payer: Self-pay | Admitting: Advanced Practice Midwife

## 2016-12-07 ENCOUNTER — Inpatient Hospital Stay (HOSPITAL_COMMUNITY)
Admission: AD | Admit: 2016-12-07 | Discharge: 2016-12-07 | Disposition: A | Discharge status: Home / Self Care | Payer: Medicaid Other | Source: Ambulatory Visit | Attending: Family Medicine | Admitting: Family Medicine

## 2016-12-07 DIAGNOSIS — O368120 Decreased fetal movements, second trimester, not applicable or unspecified: Secondary | ICD-10-CM | POA: Insufficient documentation

## 2016-12-07 DIAGNOSIS — O26892 Other specified pregnancy related conditions, second trimester: Secondary | ICD-10-CM | POA: Insufficient documentation

## 2016-12-07 DIAGNOSIS — Z3689 Encounter for other specified antenatal screening: Secondary | ICD-10-CM

## 2016-12-07 DIAGNOSIS — Z3A27 27 weeks gestation of pregnancy: Secondary | ICD-10-CM

## 2016-12-07 DIAGNOSIS — R109 Unspecified abdominal pain: Secondary | ICD-10-CM | POA: Diagnosis not present

## 2016-12-07 DIAGNOSIS — O162 Unspecified maternal hypertension, second trimester: Secondary | ICD-10-CM | POA: Diagnosis not present

## 2016-12-07 DIAGNOSIS — Z79899 Other long term (current) drug therapy: Secondary | ICD-10-CM | POA: Diagnosis not present

## 2016-12-07 DIAGNOSIS — O26899 Other specified pregnancy related conditions, unspecified trimester: Secondary | ICD-10-CM

## 2016-12-07 LAB — URINALYSIS, ROUTINE W REFLEX MICROSCOPIC
Bilirubin Urine: NEGATIVE
Glucose, UA: NEGATIVE mg/dL
Hgb urine dipstick: NEGATIVE
Ketones, ur: NEGATIVE mg/dL
Nitrite: NEGATIVE
Protein, ur: 30 mg/dL — AB
Specific Gravity, Urine: 1.027 (ref 1.005–1.030)
pH: 5 (ref 5.0–8.0)

## 2016-12-07 NOTE — MAU Note (Signed)
About 3:00 this morning had a sharp from waist to lower abd, went away.  At 0600, went to work... Started having pain again, comes and goes, feeling pressure in pelvic.  Also noting decreased fetal movement.

## 2016-12-07 NOTE — Discharge Instructions (Signed)

## 2016-12-07 NOTE — Telephone Encounter (Signed)
Spoke with pt. Pt woke up having low abd pain, at her waistline. It lasted a few seconds and then went away. Pt is tender and sore to touch at waistline. No bleeding. Decreased baby movement. [place] [facility] Pt voiced understanding. JSY

## 2016-12-07 NOTE — MAU Provider Note (Signed)
History     CSN: [id number]  Arrival date and time: 12/07/16 1321  First Provider Initiated Contact with Patient 12/07/16 1356      Chief Complaint  Patient presents with  . Decreased Fetal Movement  . Abdominal Pain   HPI  Shelly Hancock is a 26 y.o. G2P1001 at 4930w3d who presents with abdominal pain & decreased fetal movement. Symptoms began this morning. Reports intermittent sharp pains that radiate from upper abdomen to her vagina. Pain occurs 3 times per hour. Rates pain 6/10 when it occurs; no pain currently. Denies n/v/d, constipation, dysuria, vaginal bleeding, LOF, or recent intercourse.  PTA, reports no fetal movement since this morning around 6 am this morning. States movement has returned since arriving to MAU.   OB History    Gravida Para Term Preterm AB Living   2 1 1  0 0 1   SAB TAB Ectopic Multiple Live Births   0 0 0 0 1      Past Medical History:  Diagnosis Date  . Eczema   . Headache(784.0)   . Hypertension   . Postpartum depression   . Urinary tract infection   . Vaginal Pap smear, abnormal     Past Surgical History:  Procedure Laterality Date  . NO PAST SURGERIES      Family History  Problem Relation Age of Onset  . Heart attack Paternal Grandfather   . Diabetes Paternal Grandmother   . Cancer Maternal Grandmother        colon  . Heart attack Father   . Hypertension Mother   . Cancer Mother        uterine cancer  . Cancer Maternal Aunt        skin  . Cancer Maternal Uncle        lung cancer  . Other Neg Hx     Social History  Substance Use Topics  . Smoking status: Never Smoker  . Smokeless tobacco: Never Used  . Alcohol use No     Comment: not with preg    Allergies: No Known Allergies  Prescriptions Prior to Admission  Medication Sig Dispense Refill Last Dose  . Docosahexaenoic Acid (DHA COMPLETE PO) Take by mouth.   Taking  . omeprazole (PRILOSEC) 20 MG capsule 1 tablet a day 30 capsule 6 Taking  . ondansetron (ZOFRAN) 4  MG tablet Take 1 every 8 hours prn nausea and vomiting 30 tablet 1 Taking  . Prenatal Vit-Fe Fumarate-FA (PRENATAL MULTIVITAMIN) TABS Take 1 tablet by mouth daily. 30 tablet 6 Taking    Review of Systems  Constitutional: Negative.   Gastrointestinal: Positive for abdominal pain. Negative for constipation, diarrhea, nausea and vomiting.  Genitourinary: Negative.    Physical Exam   Blood pressure 129/62, pulse 85, temperature 98.7 F (37.1 C), temperature source Oral, resp. rate 18, weight 233 lb 12 oz (106 kg), last menstrual period 05/29/2016, SpO2 100 %, unknown if currently breastfeeding.  Physical Exam  Nursing note and vitals reviewed. Constitutional: She is oriented to person, place, and time. She appears well-developed and well-nourished. No distress.  HENT:  Head: Normocephalic and atraumatic.  Eyes: Conjunctivae are normal. Right eye exhibits no discharge. Left eye exhibits no discharge. No scleral icterus.  Neck: Normal range of motion.  Respiratory: Effort normal. No respiratory distress.  GI: Soft. There is no tenderness.  Abdomen soft & non tender. No ctx palpated.   Neurological: She is alert and oriented to person, place, and time.  Skin: Skin is warm  and dry. She is not diaphoretic.  Psychiatric: She has a normal mood and affect. Her behavior is normal. Judgment and thought content normal.   Dilation: Closed Effacement (%): Thick Cervical Position: Anterior Exam by:: Judeth Horn NP  Fetal Tracing:  Baseline: 145 Variability: moderate Accelerations: 15x15 Decelerations: none  Toco: none MAU Course  Procedures Results for orders placed or performed during the hospital encounter of 12/07/16 (from the past 24 hour(s))  Urinalysis, Routine w reflex microscopic     Status: Abnormal   Collection Time: 12/07/16  1:55 PM  Result Value Ref Range   Color, Urine YELLOW YELLOW   APPearance HAZY (A) CLEAR   Specific Gravity, Urine 1.027 1.005 - 1.030   pH 5.0 5.0  - 8.0   Glucose, UA NEGATIVE NEGATIVE mg/dL   Hgb urine dipstick NEGATIVE NEGATIVE   Bilirubin Urine NEGATIVE NEGATIVE   Ketones, ur NEGATIVE NEGATIVE mg/dL   Protein, ur 30 (A) NEGATIVE mg/dL   Nitrite NEGATIVE NEGATIVE   Leukocytes, UA LARGE (A) NEGATIVE   RBC / HPF 0-5 0 - 5 RBC/hpf   WBC, UA 6-30 0 - 5 WBC/hpf   Bacteria, UA RARE (A) NONE SEEN   Squamous Epithelial / LPF 6-30 (A) NONE SEEN   Mucus PRESENT     MDM Reactive NST. Cervix closed/thick. No ctx palpated or on TOCO. Abdomen soft & non tender. Pt reports return of fetal movement.    Assessment and Plan  A: 1. Abdominal pain affecting pregnancy   2. Decreased fetal movements in second trimester, single or unspecified fetus   3. NST (non-stress test) reactive    P: Discharge home Discussed reasons to return to MAU Keep f/u with OB  Judeth Horn 12/07/2016, 1:55 PM

## 2016-12-09 LAB — CULTURE, OB URINE

## 2016-12-10 ENCOUNTER — Other Ambulatory Visit: Payer: 59

## 2016-12-10 ENCOUNTER — Encounter: Payer: Self-pay | Admitting: Women's Health

## 2016-12-10 ENCOUNTER — Encounter: Payer: 59 | Admitting: Women's Health

## 2016-12-10 ENCOUNTER — Ambulatory Visit (INDEPENDENT_AMBULATORY_CARE_PROVIDER_SITE_OTHER): Payer: 59 | Admitting: Women's Health

## 2016-12-10 VITALS — BP 122/68 | HR 91 | Wt 232.5 lb

## 2016-12-10 DIAGNOSIS — Z3A27 27 weeks gestation of pregnancy: Secondary | ICD-10-CM

## 2016-12-10 DIAGNOSIS — O350XX0 Maternal care for (suspected) central nervous system malformation in fetus, not applicable or unspecified: Secondary | ICD-10-CM

## 2016-12-10 DIAGNOSIS — O0990 Supervision of high risk pregnancy, unspecified, unspecified trimester: Secondary | ICD-10-CM

## 2016-12-10 DIAGNOSIS — Z23 Encounter for immunization: Secondary | ICD-10-CM

## 2016-12-10 DIAGNOSIS — Z131 Encounter for screening for diabetes mellitus: Secondary | ICD-10-CM

## 2016-12-10 DIAGNOSIS — IMO0001 Reserved for inherently not codable concepts without codable children: Secondary | ICD-10-CM

## 2016-12-10 DIAGNOSIS — Z331 Pregnant state, incidental: Secondary | ICD-10-CM

## 2016-12-10 DIAGNOSIS — O288 Other abnormal findings on antenatal screening of mother: Secondary | ICD-10-CM

## 2016-12-10 DIAGNOSIS — Z3482 Encounter for supervision of other normal pregnancy, second trimester: Secondary | ICD-10-CM

## 2016-12-10 DIAGNOSIS — R8271 Bacteriuria: Secondary | ICD-10-CM

## 2016-12-10 DIAGNOSIS — R030 Elevated blood-pressure reading, without diagnosis of hypertension: Secondary | ICD-10-CM

## 2016-12-10 DIAGNOSIS — Z1389 Encounter for screening for other disorder: Secondary | ICD-10-CM

## 2016-12-10 LAB — POCT URINALYSIS DIPSTICK
Blood, UA: NEGATIVE
Glucose, UA: NEGATIVE
Ketones, UA: NEGATIVE
Nitrite, UA: NEGATIVE
Protein, UA: NEGATIVE

## 2016-12-10 NOTE — Patient Instructions (Signed)

## 2016-12-10 NOTE — Progress Notes (Signed)
Low-risk OB appointment G2P1001 [redacted]w[redacted]d Estimated Date of Delivery: 03/05/17 BP 122/68   Pulse 91   Wt 232 lb 8 oz (105.5 kg)   LMP 05/29/2016   BMI 36.41 kg/m   BP, weight, and urine reviewed.  Refer to obstetrical flow sheet for FH & FHR.  Reports good fm.  Denies regular uc's, lof, vb, or uti s/s. Had some lower abd pains the other night so went to [facility], all normal. Pre-e dx has been removed- per LHE at last visit he truly believes it was her bp cuff at home, all bp's here have been normal. Pt no longer checking bp at home. Stress has decreased at work, no longer has to do the 'stressful tasks' she was assigned to. Feels much better! Reviewed ptl s/s, fkc. Recommended Tdap at HD/PCP per CDC guidelines.  Plan:  Continue routine obstetrical care  F/U in 4wks for OB appointment  PN2 and flu shot today

## 2016-12-11 LAB — HIV ANTIBODY (ROUTINE TESTING W REFLEX): HIV Screen 4th Generation wRfx: NONREACTIVE

## 2016-12-11 LAB — CBC
Hematocrit: 31.9 % — ABNORMAL LOW (ref 34.0–46.6)
Hemoglobin: 10.8 g/dL — ABNORMAL LOW (ref 11.1–15.9)
MCH: 30.8 pg (ref 26.6–33.0)
MCHC: 33.9 g/dL (ref 31.5–35.7)
MCV: 91 fL (ref 79–97)
Platelets: 204 10*3/uL (ref 150–379)
RBC: 3.51 x10E6/uL — ABNORMAL LOW (ref 3.77–5.28)
RDW: 13.1 % (ref 12.3–15.4)
WBC: 9.1 10*3/uL (ref 3.4–10.8)

## 2016-12-11 LAB — GLUCOSE TOLERANCE, 2 HOURS W/ 1HR
Glucose, 1 hour: 125 mg/dL (ref 65–179)
Glucose, 2 hour: 104 mg/dL (ref 65–152)
Glucose, Fasting: 77 mg/dL (ref 65–91)

## 2016-12-11 LAB — ANTIBODY SCREEN: Antibody Screen: NEGATIVE

## 2016-12-11 LAB — RPR: RPR Ser Ql: NONREACTIVE

## 2016-12-21 ENCOUNTER — Encounter: Payer: Self-pay | Admitting: Women's Health

## 2017-01-04 ENCOUNTER — Encounter: Payer: Self-pay | Admitting: Women's Health

## 2017-01-05 ENCOUNTER — Inpatient Hospital Stay (HOSPITAL_COMMUNITY)
Admission: AD | Admit: 2017-01-05 | Discharge: 2017-01-05 | Disposition: A | Discharge status: Home / Self Care | Payer: Medicaid Other | Source: Ambulatory Visit | Attending: Family Medicine | Admitting: Family Medicine

## 2017-01-05 ENCOUNTER — Telehealth: Payer: Self-pay | Admitting: *Deleted

## 2017-01-05 ENCOUNTER — Encounter (HOSPITAL_COMMUNITY): Payer: Self-pay | Admitting: *Deleted

## 2017-01-05 DIAGNOSIS — N898 Other specified noninflammatory disorders of vagina: Secondary | ICD-10-CM | POA: Diagnosis not present

## 2017-01-05 DIAGNOSIS — O23593 Infection of other part of genital tract in pregnancy, third trimester: Secondary | ICD-10-CM | POA: Diagnosis not present

## 2017-01-05 DIAGNOSIS — N760 Acute vaginitis: Secondary | ICD-10-CM | POA: Insufficient documentation

## 2017-01-05 DIAGNOSIS — Z87440 Personal history of urinary (tract) infections: Secondary | ICD-10-CM | POA: Diagnosis not present

## 2017-01-05 DIAGNOSIS — Z800 Family history of malignant neoplasm of digestive organs: Secondary | ICD-10-CM | POA: Diagnosis not present

## 2017-01-05 DIAGNOSIS — Z808 Family history of malignant neoplasm of other organs or systems: Secondary | ICD-10-CM | POA: Insufficient documentation

## 2017-01-05 DIAGNOSIS — Z801 Family history of malignant neoplasm of trachea, bronchus and lung: Secondary | ICD-10-CM | POA: Diagnosis not present

## 2017-01-05 DIAGNOSIS — B9689 Other specified bacterial agents as the cause of diseases classified elsewhere: Secondary | ICD-10-CM | POA: Diagnosis not present

## 2017-01-05 DIAGNOSIS — Z8049 Family history of malignant neoplasm of other genital organs: Secondary | ICD-10-CM | POA: Diagnosis not present

## 2017-01-05 DIAGNOSIS — Z3A31 31 weeks gestation of pregnancy: Secondary | ICD-10-CM | POA: Diagnosis not present

## 2017-01-05 DIAGNOSIS — Z833 Family history of diabetes mellitus: Secondary | ICD-10-CM | POA: Insufficient documentation

## 2017-01-05 DIAGNOSIS — Z3689 Encounter for other specified antenatal screening: Secondary | ICD-10-CM

## 2017-01-05 DIAGNOSIS — O163 Unspecified maternal hypertension, third trimester: Secondary | ICD-10-CM | POA: Diagnosis not present

## 2017-01-05 DIAGNOSIS — O26893 Other specified pregnancy related conditions, third trimester: Secondary | ICD-10-CM | POA: Diagnosis not present

## 2017-01-05 DIAGNOSIS — Z8249 Family history of ischemic heart disease and other diseases of the circulatory system: Secondary | ICD-10-CM | POA: Insufficient documentation

## 2017-01-05 LAB — POCT FERN TEST: POCT Fern Test: NEGATIVE

## 2017-01-05 LAB — URINALYSIS, ROUTINE W REFLEX MICROSCOPIC
Bilirubin Urine: NEGATIVE
Glucose, UA: NEGATIVE mg/dL
Hgb urine dipstick: NEGATIVE
Ketones, ur: NEGATIVE mg/dL
Nitrite: NEGATIVE
Protein, ur: NEGATIVE mg/dL
Specific Gravity, Urine: 1.019 (ref 1.005–1.030)
pH: 7 (ref 5.0–8.0)

## 2017-01-05 LAB — WET PREP, GENITAL
Sperm: NONE SEEN
Trich, Wet Prep: NONE SEEN
Yeast Wet Prep HPF POC: NONE SEEN

## 2017-01-05 MED ORDER — METRONIDAZOLE 500 MG PO TABS: 500.0000 mg | ORAL_TABLET | Freq: Two times a day (BID) | ORAL | 0 refills | Status: DC

## 2017-01-05 NOTE — MAU Note (Signed)
LOF, clear, first noticed this am when she got up at 445 States has felt "small leaks' since this am  Denies vaginal bleeding  +FM

## 2017-01-05 NOTE — Telephone Encounter (Signed)
Did not return pts call as pt is currently being seen at [facility]

## 2017-01-05 NOTE — Discharge Instructions (Signed)

## 2017-01-05 NOTE — MAU Note (Signed)
Pt presents with c/o LOF since 0800 this am.  Reports fluid has continued to intermittently leak, but hasn't needed to wear a pad/pantyliner.  Pt reports fluid is clear & without odor.  Denies VB. Reports +FM.

## 2017-01-05 NOTE — MAU Provider Note (Signed)
History   [id number]   Chief Complaint  Patient presents with  . Rupture of Membranes    HPI Shelly Hancock is a 26 y.o. female  G2P1001 .4 weeks here with report of leaking small amt of clear fluid around 0700. She leaked again later in morning, just a small amt.  Pt reports no contractions. She denies vaginal bleeding. Last intercourse was yesterday. She reports good fetal movement. All other systems negative.    Patient's last menstrual period was 05/29/2016.  OB History  Gravida Para Term Preterm AB Living  0 0 1  SAB TAB Ectopic Multiple Live Births  0 0 0 0 1    # Outcome Date GA Lbr Len/2nd Weight Sex Delivery Anes PTL Lv  2 Current           1 Term 11/02/11 [redacted]w[redacted]d 14:15 / 01:00 7 lb 3.9 oz (3.286 kg) M Vag-Spont EPI  LIV      Past Medical History:  Diagnosis Date  . Eczema   . Headache(784.0)    Migraines  . Hypertension    with this pregnancy  . Postpartum depression   . Urinary tract infection   . Vaginal Pap smear, abnormal     Family History  Problem Relation Age of Onset  . Heart attack Paternal Grandfather   . Diabetes Paternal Grandmother   . Cancer Maternal Grandmother        colon  . Heart attack Father   . Hypertension Mother   . Cancer Mother        uterine cancer  . Cancer Maternal Aunt        skin  . Cancer Maternal Uncle        lung cancer  . Other Neg Hx     Social History   Social History  . Marital status: Single    Spouse name: N/A  . Number of children: N/A  . Years of education: N/A   Social History Main Topics  . Smoking status: Never Smoker  . Smokeless tobacco: Never Used  . Alcohol use No     Comment: not with preg  . Drug use: No  . Sexual activity: Yes    Birth control/ protection: None   Other Topics Concern  . None   Social History Narrative  . None    No Known Allergies  No current facility-administered medications on file prior to encounter.    Current Outpatient Prescriptions on  File Prior to Encounter  Medication Sig Dispense Refill  . Docosahexaenoic Acid (DHA COMPLETE PO) Take by mouth daily.     Marland Kitchen omeprazole (PRILOSEC) 20 MG capsule 1 tablet a day 30 capsule 6  . ondansetron (ZOFRAN) 4 MG tablet Take 1 every 8 hours prn nausea and vomiting 30 tablet 1  . Prenatal Vit-Fe Fumarate-FA (PRENATAL MULTIVITAMIN) TABS Take 1 tablet by mouth daily. 30 tablet 6     Review of Systems  Gastrointestinal: Negative for abdominal pain.  Genitourinary: Positive for vaginal discharge.     Physical Exam   Vitals:   01/05/17 1115  BP: 133/69  Pulse: 100  Resp: 18  Temp: 98 F (36.7 C)  TempSrc: Oral  SpO2: 100%  Weight: 236 lb 0.6 oz (107.1 kg)    Physical Exam  Constitutional: She is oriented to person, place, and time. She appears well-developed and well-nourished. No distress.  HENT:  Head: Normocephalic and atraumatic.  Neck: Normal range of motion.  Cardiovascular: Normal rate.  Respiratory: Effort normal. No respiratory distress.  GI: Soft. She exhibits no distension. There is no tenderness.  Genitourinary:  Genitourinary Comments: External: no lesions or erythema Vagina: rugated, pink, moist, small mucousy yellow discharge, no pool, fern neg Cervix closed/thick  Musculoskeletal: Normal range of motion.  Neurological: She is alert and oriented to person, place, and time.  Skin: Skin is warm and dry.  Psychiatric: She has a normal mood and affect.  EFM: 135 bpm, mod variability, + accels, no decels Toco: none  Results for orders placed or performed during the hospital encounter of 01/05/17 (from the past 24 hour(s))  Urinalysis, Routine w reflex microscopic     Status: Abnormal   Collection Time: 01/05/17 11:12 AM  Result Value Ref Range   Color, Urine YELLOW YELLOW   APPearance CLOUDY (A) CLEAR   Specific Gravity, Urine 1.019 1.005 - 1.030   pH 7.0 5.0 - 8.0   Glucose, UA NEGATIVE NEGATIVE mg/dL   Hgb urine dipstick NEGATIVE NEGATIVE    Bilirubin Urine NEGATIVE NEGATIVE   Ketones, ur NEGATIVE NEGATIVE mg/dL   Protein, ur NEGATIVE NEGATIVE mg/dL   Nitrite NEGATIVE NEGATIVE   Leukocytes, UA LARGE (A) NEGATIVE   RBC / HPF 0-5 0 - 5 RBC/hpf   WBC, UA 6-30 0 - 5 WBC/hpf   Bacteria, UA RARE (A) NONE SEEN   Squamous Epithelial / LPF 6-30 (A) NONE SEEN   Mucus PRESENT   Wet prep, genital     Status: Abnormal   Collection Time: 01/05/17 11:56 AM  Result Value Ref Range   Yeast Wet Prep HPF POC NONE SEEN NONE SEEN   Trich, Wet Prep NONE SEEN NONE SEEN   Clue Cells Wet Prep HPF POC PRESENT (A) NONE SEEN   WBC, Wet Prep HPF POC MANY (A) NONE SEEN   Sperm NONE SEEN    MAU Course  Procedures  MDM Labs ordered and reviewed. No evidence of ROM. Will treat BV. Stable for discharge home.  Assessment and Plan   1. [redacted] weeks gestation of pregnancy   2. NST (non-stress test) reactive   3. Bacterial vaginosis    Discharge home Follow up in OB office later this week as scheduled Rx Flagyl PTL precautions  Allergies as of 01/05/2017   No Known Allergies     Medication List    TAKE these medications   DHA COMPLETE PO Take by mouth daily.   metroNIDAZOLE 500 MG tablet Commonly known as:  FLAGYL Take 1 tablet (500 mg total) by mouth 2 (two) times daily.   omeprazole 20 MG capsule Commonly known as:  PRILOSEC 1 tablet a day   ondansetron 4 MG tablet Commonly known as:  ZOFRAN Take 1 every 8 hours prn nausea and vomiting   prenatal multivitamin Tabs tablet Take 1 tablet by mouth daily.      Donette Larry, CNM 01/05/2017 12:17 PM

## 2017-01-07 ENCOUNTER — Encounter: Payer: 59 | Admitting: Obstetrics and Gynecology

## 2017-01-07 ENCOUNTER — Encounter: Payer: Self-pay | Admitting: Women's Health

## 2017-01-10 ENCOUNTER — Encounter: Payer: Self-pay | Admitting: Obstetrics and Gynecology

## 2017-01-10 ENCOUNTER — Telehealth: Payer: Self-pay | Admitting: Obstetrics & Gynecology

## 2017-01-10 ENCOUNTER — Ambulatory Visit (INDEPENDENT_AMBULATORY_CARE_PROVIDER_SITE_OTHER): Payer: 59 | Admitting: Obstetrics and Gynecology

## 2017-01-10 ENCOUNTER — Encounter: Payer: Self-pay | Admitting: *Deleted

## 2017-01-10 ENCOUNTER — Telehealth: Payer: Self-pay | Admitting: *Deleted

## 2017-01-10 VITALS — BP 130/80 | HR 110 | Wt 235.2 lb

## 2017-01-10 DIAGNOSIS — Z3A32 32 weeks gestation of pregnancy: Secondary | ICD-10-CM

## 2017-01-10 DIAGNOSIS — O133 Gestational [pregnancy-induced] hypertension without significant proteinuria, third trimester: Secondary | ICD-10-CM

## 2017-01-10 DIAGNOSIS — Z3482 Encounter for supervision of other normal pregnancy, second trimester: Secondary | ICD-10-CM

## 2017-01-10 DIAGNOSIS — Z331 Pregnant state, incidental: Secondary | ICD-10-CM

## 2017-01-10 DIAGNOSIS — Z1389 Encounter for screening for other disorder: Secondary | ICD-10-CM

## 2017-01-10 LAB — POCT URINALYSIS DIPSTICK
Blood, UA: 3
Glucose, UA: NEGATIVE
Ketones, UA: NEGATIVE
Nitrite, UA: NEGATIVE
Protein, UA: NEGATIVE

## 2017-01-10 NOTE — Progress Notes (Signed)
Len Kluver is a 26 y.o. female G2P1001  Estimated Date of Delivery: 03/05/17 [facility] [redacted]w[redacted]d  Chief Complaint  Patient presents with  . Routine Prenatal Visit    no fetal movement on Sunday or this am/ pain rt side of stomach and lt side of rib cage area  ____  Patient complaining of pain rt side of stomach beginning yesterday and left side of rib cage. The pain bothers her to the point that walking is difficult. She reports not feeling baby move since yesterday lunchtime. Pt went to MAU on 01/05/17 for leaking small amount of clear fluid, and was dx with BV, prescribed Flagyl.   Patient reports decreased fetal movement since sunday                          denies any bleeding , rupture of membranes,or regular contractions.  Blood pressure 130/80, pulse (!) 110, weight 235 lb 3.2 oz (106.7 kg), last menstrual period 05/29/2016, unknown if currently breastfeeding.   Urine results:notable for 3+ blood, 1+WBC refer to the ob flow sheet for FH and FHR, ,                          Physical Examination: General appearance - alert, well appearing, and in no distress                                      Abdomen - FH 34 ,                                                         -FHR 140 with accelerations to 175                                                         soft, nontender, nondistended, no masses or organomegaly                                      Pelvic - Not indicated  Monitor: Hearing fetal movements on the monitor, pt is beginning to feel baby as well. agitative wonderfully reactive                                             Questions were answered. Assessment: HROB G2P1001 @ [redacted]w[redacted]d Estimated Date of Delivery: 03/05/17 Gestational hTN vs Pre-E remote from term, now inproved with resolved HTN today  Plan:  Continued routine obstetrical care, HROB, 2x a week U/S, Collect a 24 Hr urine from pt  F/u in 2 weeks for LROB, BPP on Thursday or Friday,     By signing my name below, I,  Izna Ahmed, attest that this documentation has been prepared under the direction and in the presence of [name], MD. Electronically Signed: Redge Gainer, Medical Scribe. 01/10/17. 9:15 AM.  I personally performed the services described in this documentation, which was SCRIBED in my presence. The recorded information has been reviewed and considered accurate. It has been edited as necessary during review. [name], MD

## 2017-01-12 ENCOUNTER — Encounter: Payer: Self-pay | Admitting: Women's Health

## 2017-01-13 LAB — PROTEIN, URINE, 24 HOUR
Protein, 24H Urine: 198 mg/24 hr — ABNORMAL HIGH (ref 30–150)
Protein, Ur: 22 mg/dL

## 2017-01-14 ENCOUNTER — Other Ambulatory Visit: Payer: Self-pay | Admitting: Obstetrics and Gynecology

## 2017-01-14 ENCOUNTER — Ambulatory Visit (INDEPENDENT_AMBULATORY_CARE_PROVIDER_SITE_OTHER): Payer: 59 | Admitting: Women's Health

## 2017-01-14 ENCOUNTER — Ambulatory Visit (INDEPENDENT_AMBULATORY_CARE_PROVIDER_SITE_OTHER): Payer: 59

## 2017-01-14 ENCOUNTER — Encounter: Payer: Self-pay | Admitting: Women's Health

## 2017-01-14 VITALS — BP 132/78 | HR 99 | Wt 236.0 lb

## 2017-01-14 DIAGNOSIS — Z3483 Encounter for supervision of other normal pregnancy, third trimester: Secondary | ICD-10-CM

## 2017-01-14 DIAGNOSIS — O133 Gestational [pregnancy-induced] hypertension without significant proteinuria, third trimester: Secondary | ICD-10-CM

## 2017-01-14 DIAGNOSIS — Z3A32 32 weeks gestation of pregnancy: Secondary | ICD-10-CM

## 2017-01-14 DIAGNOSIS — Z331 Pregnant state, incidental: Secondary | ICD-10-CM

## 2017-01-14 DIAGNOSIS — Z3403 Encounter for supervision of normal first pregnancy, third trimester: Secondary | ICD-10-CM

## 2017-01-14 DIAGNOSIS — Z1389 Encounter for screening for other disorder: Secondary | ICD-10-CM

## 2017-01-14 LAB — POCT URINALYSIS DIPSTICK
Blood, UA: NEGATIVE
Glucose, UA: NEGATIVE
Ketones, UA: NEGATIVE
Leukocytes, UA: NEGATIVE
Nitrite, UA: NEGATIVE
Protein, UA: NEGATIVE

## 2017-01-14 NOTE — Patient Instructions (Addendum)
Call the office [phone]) or go to [facility] if:  You begin to have strong, frequent contractions  Your water breaks.  Sometimes it is a big gush of fluid, sometimes it is just a trickle that keeps getting your panties wet or running down your legs  You have vaginal bleeding.  It is normal to have a small amount of spotting if your cervix was checked.   You don't feel your baby moving like normal.  If you don't, get you something to eat and drink and lay down and focus on feeling your baby move.  You should feel at least 10 movements in 2 hours.  If you don't, you should call the office or go to [facility].    For your lower back pain you may:  Purchase a pregnancy belt from [employer], [employer], [employer], etc and wear it while you are up and about  Take warm baths  Use a heating pad to your lower back for no longer than 20 minutes at a time, and do not place near abdomen  Take tylenol as needed. Please follow directions on the bottle  Kinesthesiology tape (can get from sporting goods store), google how to tape belly for pregnancy   Preterm Labor and Birth Information The normal length of a pregnancy is 39-41 weeks. Preterm labor is when labor starts before 37 completed weeks of pregnancy. What are the risk factors for preterm labor? Preterm labor is more likely to occur in women who:  Have certain infections during pregnancy such as a bladder infection, sexually transmitted infection, or infection inside the uterus (chorioamnionitis).  Have a shorter-than-normal cervix.  Have gone into preterm labor before.  Have had surgery on their cervix.  Are younger than age 24 or older than age 24.  Are African American.  Are pregnant with twins or multiple babies (multiple gestation).  Take street drugs or smoke while pregnant.  Do not gain enough weight while pregnant.  Became pregnant shortly after having been pregnant.  What are the symptoms of  preterm labor? Symptoms of preterm labor include:  Cramps similar to those that can happen during a menstrual period. The cramps may happen with diarrhea.  Pain in the abdomen or lower back.  Regular uterine contractions that may feel like tightening of the abdomen.  A feeling of increased pressure in the pelvis.  Increased watery or bloody mucus discharge from the vagina.  Water breaking (ruptured amniotic sac).  Why is it important to recognize signs of preterm labor? It is important to recognize signs of preterm labor because babies who are born prematurely may not be fully developed. This can put them at an increased risk for:  Long-term (chronic) heart and lung problems.  Difficulty immediately after birth with regulating body systems, including blood sugar, body temperature, heart rate, and breathing rate.  Bleeding in the brain.  Cerebral palsy.  Learning difficulties.  Death.  These risks are highest for babies who are born before 34 weeks of pregnancy. How is preterm labor treated? Treatment depends on the length of your pregnancy, your condition, and the health of your baby. It may involve:  Having a stitch (suture) placed in your cervix to prevent your cervix from opening too early (cerclage).  Taking or being given medicines, such as: ? Hormone medicines. These may be given early in pregnancy to help support the pregnancy. ? Medicine to stop contractions. ? Medicines to help mature the baby's lungs. These may be prescribed if  the risk of delivery is high. ? Medicines to prevent your baby from developing cerebral palsy.  If the labor happens before 34 weeks of pregnancy, you may need to stay in the hospital. What should I do if I think I am in preterm labor? If you think that you are going into preterm labor, call your health care provider right away. How can I prevent preterm labor in future pregnancies? To increase your chance of having a full-term  pregnancy:  Do not use any tobacco products, such as cigarettes, chewing tobacco, and e-cigarettes. If you need help quitting, ask your health care provider.  Do not use street drugs or medicines that have not been prescribed to you during your pregnancy.  Talk with your health care provider before taking any herbal supplements, even if you have been taking them regularly.  Make sure you gain a healthy amount of weight during your pregnancy.  Watch for infection. If you think that you might have an infection, get it checked right away.  Make sure to tell your health care provider if you have gone into preterm labor before.  This information is not intended to replace advice given to you by your health care provider. Make sure you discuss any questions you have with your health care provider. Document Released: 06/05/2003 Document Revised: 08/26/2015 Document Reviewed: 08/06/2015 Elsevier Interactive Patient Education  2018 [employer].

## 2017-01-14 NOTE — Progress Notes (Signed)
LOW-RISK PREGNANCY VISIT Patient name: Shelly Hancock MRN [id number]  Date of birth: 05/10/1990 Chief Complaint:   Routine Prenatal Visit (u/s today)  History of Present Illness:   Shelly Hancock is a 26 y.o. 512P1001 female at 6958w6d with an Estimated Date of Delivery: 03/05/17 being seen today for ongoing management of a low-risk pregnancy.  Today she reports intermittent pain ~6in to Rt of umbilicus when laying down on back at night- didn't know she wasn't supposed to lay on back; intermittent sharp pain under Lt breast at same time as Rt side abd pain, but also has this some throughout the day as well. Also constant low back pain and soreness in Rt knee at same time- doesn't radiate from back down to knee/pains are separate. States she usually feels her menstrual cramps as soreness in knees- but she does not feel she is having cramping/contractions. Contractions: Not present. Vag. Bleeding: None.  Movement: Present. denies leaking of fluid. Review of Systems:   Pertinent items are noted in HPI Denies abnormal vaginal discharge w/ itching/odor/irritation, headaches, visual changes, shortness of breath, chest pain, abdominal pain, severe nausea/vomiting, or problems with urination or bowel movements unless otherwise stated above. Pertinent History Reviewed:  Reviewed past medical,surgical, social, obstetrical and family history.  Reviewed problem list, medications and allergies. Physical Assessment:   Vitals:   01/14/17 1250  BP: 132/78  Pulse: 99  Weight: 236 lb (107 kg)  Body mass index is 36.96 kg/m.        Physical Examination:   General appearance: Well appearing, and in no distress  Mental status: Alert, oriented to person, place, and time  Skin: Warm & dry  Cardiovascular: Normal heart rate noted  Respiratory: Normal respiratory effort, no distress  Abdomen: Soft, gravid, nontender, no evidence of hernia, no tenderness to palpation  Pelvic: Cervical exam deferred     Extremities: Edema: Trace  Fetal Status: Fetal Heart Rate (bpm): 126 u/s Fundal Height: 32 cm Movement: Present Presentation: Vertex  Today's u/s ordered last visit d/t prev dx of ghtn/pre-e- which has since been removed:  US 32+6 wks,cephalic,fhr 126 bpm,normal ovaries bilat, BPP 8/8,post pl gr 1,afi 15 cm,RI .51,.59=22%    Results for orders placed or performed in visit on 01/14/17 (from the past 24 hour(s))  POCT urinalysis dipstick   Collection Time: 01/14/17 12:52 PM  Result Value Ref Range   Color, UA     Clarity, UA     Glucose, UA neg    Bilirubin, UA     Ketones, UA neg    Spec Grav, UA  1.010 - 1.025   Blood, UA neg    pH, UA  5.0 - 8.0   Protein, UA neg    Urobilinogen, UA  0.2 or 1.0 E.U./dL   Nitrite, UA neg    Leukocytes, UA Negative Negative    Assessment & Plan:  1) Low-risk pregnancy G2P1001 at 1958w6d with an Estimated Date of Delivery: 03/05/17   2) Mulitple aches/pains, non-specific  3) Low back pain> gave printed prevention/relief measures   4) Prev dx of ghtn/pre-e> dx was made based on home bp's then elevated p:c ratio- it was determined her home bp cuff was bad, so dx removed at 25wks, all bp's here normal, no sx, 24hr urine ordered last week normal: 198, u/s today normal   Labs/procedures today: u/s  Plan:  Continue routine obstetrical care   Reviewed: Preterm labor symptoms and general obstetric precautions including but not limited to vaginal bleeding, contractions, leaking  of fluid and fetal movement were reviewed in detail with the patient.  Discussed pre-e warning s/s. All questions were answered  Follow-up: Return in about 1 week (around 01/21/2017) for LROB.  Orders Placed This Encounter  Procedures  . POCT urinalysis dipstick   Marge Duncans CNM, [facility] 01/14/2017 1:35 PM

## 2017-01-14 NOTE — Progress Notes (Signed)
US 32+6 wks,cephalic,fhr 126 bpm,normal ovaries bilat, BPP 8/8,post pl gr 1,afi 15 cm,RI .51,.59=22%

## 2017-01-16 ENCOUNTER — Encounter: Payer: Self-pay | Admitting: Obstetrics and Gynecology

## 2017-01-18 ENCOUNTER — Telehealth: Payer: Self-pay | Admitting: *Deleted

## 2017-01-18 ENCOUNTER — Encounter: Payer: Self-pay | Admitting: Women's Health

## 2017-01-18 NOTE — Telephone Encounter (Signed)
I called pt following up on pt email. I advised if pt starts having dizziness, seeing spots, blurred vision or if headache is not relieved by Tylenol, go to [facility] Spotting can be normal after sex or after a BM. Advised to keep appt for Friday and call sooner with any concerns. Pt voiced understanding. JSY

## 2017-01-20 ENCOUNTER — Encounter: Payer: Self-pay | Admitting: Advanced Practice Midwife

## 2017-01-20 ENCOUNTER — Inpatient Hospital Stay (HOSPITAL_COMMUNITY)
Admission: AD | Admit: 2017-01-20 | Discharge: 2017-01-20 | Disposition: A | Discharge status: Home / Self Care | Payer: Medicaid Other | Source: Ambulatory Visit | Attending: Obstetrics and Gynecology | Admitting: Obstetrics and Gynecology

## 2017-01-20 ENCOUNTER — Encounter (HOSPITAL_COMMUNITY): Payer: Self-pay

## 2017-01-20 DIAGNOSIS — R51 Headache: Secondary | ICD-10-CM | POA: Diagnosis not present

## 2017-01-20 DIAGNOSIS — O26893 Other specified pregnancy related conditions, third trimester: Secondary | ICD-10-CM | POA: Insufficient documentation

## 2017-01-20 DIAGNOSIS — R519 Headache, unspecified: Secondary | ICD-10-CM

## 2017-01-20 DIAGNOSIS — Z3A33 33 weeks gestation of pregnancy: Secondary | ICD-10-CM

## 2017-01-20 DIAGNOSIS — O133 Gestational [pregnancy-induced] hypertension without significant proteinuria, third trimester: Secondary | ICD-10-CM | POA: Insufficient documentation

## 2017-01-20 DIAGNOSIS — O9989 Other specified diseases and conditions complicating pregnancy, childbirth and the puerperium: Secondary | ICD-10-CM | POA: Diagnosis not present

## 2017-01-20 LAB — URINALYSIS, ROUTINE W REFLEX MICROSCOPIC
Bilirubin Urine: NEGATIVE
Glucose, UA: NEGATIVE mg/dL
Hgb urine dipstick: NEGATIVE
Ketones, ur: NEGATIVE mg/dL
Leukocytes, UA: NEGATIVE
Nitrite: NEGATIVE
Protein, ur: NEGATIVE mg/dL
Specific Gravity, Urine: 1.014 (ref 1.005–1.030)
pH: 7 (ref 5.0–8.0)

## 2017-01-20 MED ORDER — BUTALBITAL-APAP-CAFFEINE 50-325-40 MG PO TABS
2.0000 | ORAL_TABLET | Freq: Once | ORAL | Status: AC
  Administered 2017-01-20: 2 via ORAL
  Filled 2017-01-20: qty 2

## 2017-01-20 MED ORDER — BUTALBITAL-APAP-CAFFEINE 50-325-40 MG PO TABS: 2.0000 | ORAL_TABLET | Freq: Four times a day (QID) | ORAL | 0 refills | Status: DC | PRN

## 2017-01-20 NOTE — MAU Provider Note (Signed)
History     CSN: [id number]  Arrival date and time: 01/20/17 0546   None     Chief Complaint  Patient presents with  . Hypertension   26 yo G2P1001 at 5933w5d gestation presents for headache x 3 days that was only slightly relieved by tylenol. Took her blood pressure at 3 am because of headache and was 150/90. Patient had 24 hour protein last week, wnl. Denies vision changes or RUQ pain. Admits to lower abdominal cramping like "menstrual period."    OB History    Gravida Para Term Preterm AB Living   2 1 1  0 0 1   SAB TAB Ectopic Multiple Live Births   0 0 0 0 1      Past Medical History:  Diagnosis Date  . Eczema   . Headache(784.0)    Migraines  . Hypertension    with this pregnancy  . Postpartum depression   . Urinary tract infection   . Vaginal Pap smear, abnormal     Past Surgical History:  Procedure Laterality Date  . NO PAST SURGERIES      Family History  Problem Relation Age of Onset  . Heart attack Paternal Grandfather   . Diabetes Paternal Grandmother   . Cancer Maternal Grandmother        colon  . Heart attack Father   . Hypertension Mother   . Cancer Mother        uterine cancer  . Cancer Maternal Aunt        skin  . Cancer Maternal Uncle        lung cancer  . Other Neg Hx     Social History  Substance Use Topics  . Smoking status: Never Smoker  . Smokeless tobacco: Never Used  . Alcohol use No     Comment: not with preg    Allergies: No Known Allergies  Prescriptions Prior to Admission  Medication Sig Dispense Refill Last Dose  . Docosahexaenoic Acid (DHA COMPLETE PO) Take by mouth daily.    Taking  . omeprazole (PRILOSEC) 20 MG capsule 1 tablet a day 30 capsule 6 Taking  . ondansetron (ZOFRAN) 4 MG tablet Take 1 every 8 hours prn nausea and vomiting (Patient not taking: Reported on 01/10/2017) 30 tablet 1 Not Taking  . Prenatal Vit-Fe Fumarate-FA (PRENATAL MULTIVITAMIN) TABS Take 1 tablet by mouth daily. 30 tablet 6 Taking     Review of Systems  Constitutional: Negative for activity change and appetite change.  HENT: Negative for congestion and dental problem.   Eyes: Negative for discharge and itching.  Respiratory: Negative for apnea and chest tightness.   Cardiovascular: Negative for chest pain and leg swelling.  Gastrointestinal: Positive for abdominal pain. Negative for abdominal distention.  Endocrine: Negative for cold intolerance and heat intolerance.  Genitourinary: Negative for difficulty urinating and dysuria.  Musculoskeletal: Negative for arthralgias and back pain.  Neurological: Positive for headaches.   Physical Exam   Blood pressure 136/74, temperature 97.8 F (36.6 C), temperature source Oral, resp. rate 18, last menstrual period 05/29/2016, SpO2 98 %, unknown if currently breastfeeding.  Physical Exam  Constitutional: She is oriented to person, place, and time. She appears well-developed and well-nourished. No distress.  HENT:  Head: Normocephalic and atraumatic.  Eyes: Pupils are equal, round, and reactive to light. Conjunctivae are normal.  Neck: Normal range of motion. Neck supple.  Cardiovascular: Normal rate and intact distal pulses.   Respiratory: She is in respiratory distress.  GI: Soft. There  is no tenderness.  Genitourinary: Vagina normal.  Genitourinary Comments: Cervix: 1/thick/-3   Musculoskeletal: Normal range of motion. She exhibits no edema.  Neurological: She is alert and oriented to person, place, and time.  Skin: Skin is warm and dry.  Psychiatric: She has a normal mood and affect. Her behavior is normal.    MAU Course  Procedures  EFM 140 bpm/mod var/no decels  MDM On presentation, 2 initial blood pressure readings are normal. Given this information, I am not inclined to get labs to r/o preeclampsia. There was a question from Dr. Despina Hidden in August about patient's home BP cuff being bad. This may still be true. Also consider that patient only takes her blood  pressure when she has a headache. Either way, will continue to trend blood pressure for next 1 hour. Also tylenol did help headache slightly at home. Fioricet for headache.  Fioricet improved headache. Blood pressures have been normal during observation. Discharge home in stable condition. Preterm labor precautions given.   Assessment and Plan  1. Pregnancy at [redacted] weeks gestation- follow up outpatient tomorrow 2. Headache- prescribe fioricet  Shelly Hancock 01/20/2017, 7:35 AM

## 2017-01-20 NOTE — MAU Note (Signed)
Pt presents to MAU with c/o of headache that has persisted for 3 days, this morning headache has been worse. Pt has had elevated blood pressures intermittently this pregnancy. Pt had preeclampic lab work up last week and has not heard back. Pt denies vaginal bleeding and LOF. +FM

## 2017-01-20 NOTE — Discharge Instructions (Signed)
Blood Pressure Record Sheet Your blood pressure on this visit to the emergency department or clinic is elevated. This does not necessarily mean you have high blood pressure (hypertension), but it does mean that your blood pressure needs to be rechecked. Many times your blood pressure can increase due to illness, pain, anxiety, or other factors. We recommend that you get a series of blood pressure readings done over a period of 5 days. It is best to get a reading in the morning and one in the evening. You should make sure to sit and relax for 1-5 minutes before the reading is taken. Write the readings down and make a follow-up appointment with your health care provider to discuss the results. If there is not a free clinic or a drug store with a blood-pressure-taking machine near you, you can purchase blood-pressure-taking equipment from a drug store. Having one in the home allows you the convenience of taking your blood pressure while you are home and relaxed. Blood Pressure Log Date: _______________________  a.m. _____________________  p.m. _____________________  Date: _______________________  a.m. _____________________  p.m. _____________________  Date: _______________________  a.m. _____________________  p.m. _____________________  Date: _______________________  a.m. _____________________  p.m. _____________________  Date: _______________________  a.m. _____________________  p.m. _____________________  This information is not intended to replace advice given to you by your health care provider. Make sure you discuss any questions you have with your health care provider. Document Released: 12/12/2002 Document Revised: 02/27/2016 Document Reviewed: 05/08/2013 Elsevier Interactive Patient Education  2018 [employer] How to Take Your Blood Pressure You can take your blood pressure at home with a machine. You may need to check your blood pressure at home:  To check if you have  high blood pressure (hypertension).  To check your blood pressure over time.  To make sure your blood pressure medicine is working.  Supplies needed: You will need a blood pressure machine, or monitor. You can buy one at a drugstore or online. When choosing one:  Choose one with an arm cuff.  Choose one that wraps around your upper arm. Only one finger should fit between your arm and the cuff.  Do not choose one that measures your blood pressure from your wrist or finger.  Your doctor can suggest a monitor. How to prepare Avoid these things for 30 minutes before checking your blood pressure:  Drinking caffeine.  Drinking alcohol.  Eating.  Smoking.  Exercising.  Five minutes before checking your blood pressure:  Pee.  Sit in a dining chair. Avoid sitting in a soft couch or armchair.  Be quiet. Do not talk.  How to take your blood pressure Follow the instructions that came with your machine. If you have a digital blood pressure monitor, these may be the instructions: 1. Sit up straight. 2. Place your feet on the floor. Do not cross your ankles or legs. 3. Rest your left arm at the level of your heart. You may rest it on a table, desk, or chair. 4. Pull up your shirt sleeve. 5. Wrap the blood pressure cuff around the upper part of your left arm. The cuff should be 1 inch (2.5 cm) above your elbow. It is best to wrap the cuff around bare skin. 6. Fit the cuff snugly around your arm. You should be able to place only one finger between the cuff and your arm. 7. Put the cord inside the groove of your elbow. 8. Press the power button. 9. Sit quietly while the cuff   fills with air and loses air. 10. Write down the numbers on the screen. 11. Wait 2-3 minutes and then repeat steps 1-10.  What do the numbers mean? Two numbers make up your blood pressure. The first number is called systolic pressure. The second is called diastolic pressure. An example of a blood pressure  reading is "120 over 80" (or 120/80). If you are an adult and do not have a medical condition, use this guide to find out if your blood pressure is normal: Normal  First number: below 120.  Second number: below 80. Elevated  First number: 120-129.  Second number: below 80. Hypertension stage 1  First number: 130-139.  Second number: 80-89. Hypertension stage 2  First number: 140 or above.  Second number: 90 or above. Your blood pressure is above normal even if only the top or bottom number is above normal. Follow these instructions at home:  Check your blood pressure as often as your doctor tells you to.  Take your monitor to your next doctor's appointment. Your doctor will: ? Make sure you are using it correctly. ? Make sure it is working right.  Make sure you understand what your blood pressure numbers should be.  Tell your doctor if your medicines are causing side effects. Contact a doctor if:  Your blood pressure keeps being high. Get help right away if:  Your first blood pressure number is higher than 180.  Your second blood pressure number is higher than 120. This information is not intended to replace advice given to you by your health care provider. Make sure you discuss any questions you have with your health care provider. Document Released: 02/26/2008 Document Revised: 02/11/2016 Document Reviewed: 08/22/2015 Elsevier Interactive Patient Education  2018 [employer]  

## 2017-01-21 ENCOUNTER — Encounter: Payer: Self-pay | Admitting: Women's Health

## 2017-01-21 ENCOUNTER — Ambulatory Visit (INDEPENDENT_AMBULATORY_CARE_PROVIDER_SITE_OTHER): Payer: 59 | Admitting: Women's Health

## 2017-01-21 VITALS — BP 122/68 | HR 106 | Wt 238.0 lb

## 2017-01-21 DIAGNOSIS — Z3483 Encounter for supervision of other normal pregnancy, third trimester: Secondary | ICD-10-CM

## 2017-01-21 DIAGNOSIS — Z1389 Encounter for screening for other disorder: Secondary | ICD-10-CM

## 2017-01-21 DIAGNOSIS — O09893 Supervision of other high risk pregnancies, third trimester: Secondary | ICD-10-CM

## 2017-01-21 DIAGNOSIS — Z3A33 33 weeks gestation of pregnancy: Secondary | ICD-10-CM

## 2017-01-21 DIAGNOSIS — Z331 Pregnant state, incidental: Secondary | ICD-10-CM

## 2017-01-21 DIAGNOSIS — R030 Elevated blood-pressure reading, without diagnosis of hypertension: Secondary | ICD-10-CM

## 2017-01-21 LAB — POCT URINALYSIS DIPSTICK
Leukocytes, UA: NEGATIVE
Nitrite, UA: NEGATIVE
Protein, UA: NEGATIVE

## 2017-01-21 NOTE — Progress Notes (Signed)
LOW-RISK PREGNANCY VISIT Patient name: Shelly Hancock MRN [id number]  Date of birth: [birth year] Chief Complaint:   Routine Prenatal Visit  History of Present Illness:   Shelly Hancock is a 26 y.o. G1P1001 female at [redacted]w[redacted]d with an Estimated Date of Delivery: 03/05/17 being seen today for ongoing management of a low-risk pregnancy.  Today she reports went to [facility] yesterday for headache/high bp at home, all bp's normal in [facility], ha mostly resolved w/ fioricet- given rx. States she felt dizzy all day yesterday, some dizziness this am. Feels she is drinking plenty of water and eating enough protein. Does drink occ soft drink- usually when has to work. Home bp's 150s/90s- usually when she is at work. Currently working 48hr/wk, unable to cut back hours. Did get new cuff. Denies visual changes, ruq/epigastric pain, n/v. . Some cramping, lower abd pressure x 1wk. Cx was 1/thick at [facility] yesterday w/o uc's on efm.  Contractions: Not present. Vag. Bleeding: None.  Movement: Present. denies leaking of fluid. Review of Systems:   Pertinent items are noted in HPI Denies abnormal vaginal discharge w/ itching/odor/irritation, headaches, visual changes, shortness of breath, chest pain, abdominal pain, severe nausea/vomiting, or problems with urination or bowel movements unless otherwise stated above. Pertinent History Reviewed:  Reviewed past medical,surgical, social, obstetrical and family history.  Reviewed problem list, medications and allergies. Physical Assessment:   Vitals:   01/21/17 1110 01/21/17 1204  BP: 130/70 122/68  Pulse: (!) 106   Weight: 238 lb (108 kg)   Body mass index is 40.85 kg/m.        Physical Examination:   General appearance: Well appearing, and in no distress  Mental status: Alert, oriented to person, place, and time  Skin: Warm & dry  Cardiovascular: Normal heart rate noted  Respiratory: Normal respiratory effort, no distress  Abdomen: Soft, gravid, slight tenderness  generalized lower abdomen  Pelvic: Cervical exam deferred         Extremities: Edema: Trace , DTRs 2+, no clonus  Fetal Status: Fetal Heart Rate (bpm): 145 Fundal Height: 34 cm Movement: Present    Results for orders placed or performed in visit on 01/21/17 (from the past 24 hour(s))  POCT urinalysis dipstick   Collection Time: 01/21/17 11:12 AM  Result Value Ref Range   Color, UA     Clarity, UA     Glucose, UA trace    Bilirubin, UA     Ketones, UA 1+    Spec Grav, UA  1.010 - 1.025   Blood, UA trace    pH, UA  5.0 - 8.0   Protein, UA neg    Urobilinogen, UA  0.2 or 1.0 E.U./dL   Nitrite, UA neg    Leukocytes, UA Negative Negative    Assessment & Plan:  1) Low-risk pregnancy G2P1001 at [redacted]w[redacted]d with an Estimated Date of Delivery: 03/05/17   2) Elevated bp's at home> normal here and at [facility] yesterday, this is a new cuff she is taking them with. Will check pre-e labs again. Reviewed pre-e s/s, reasons to seek care.  May be work related since it is elevated mainly at work/surrounding work hours. States she is unable to cut hours back. Let us know if changes mind  3) Dizziness> increase po hydration, protein, gave printed prevention/relief measures. Checking hgb today    Labs/procedures today: pre-e labs  Plan:  Continue routine obstetrical care   Reviewed: Preterm labor symptoms and general obstetric precautions including but not limited to vaginal bleeding, contractions,  leaking of fluid and fetal movement were reviewed in detail with the patient.  All questions were answered  Follow-up: Return in about 1 week (around 01/28/2017) for LROB .  Orders Placed This Encounter  Procedures  . CBC  . Comprehensive metabolic panel  . Protein / creatinine ratio, urine  . POCT urinalysis dipstick   Marge DuncansBooker, Salathiel Ferrara Randall CNM, [facility] 01/21/2017 12:27 PM

## 2017-01-21 NOTE — Patient Instructions (Signed)
Shelly Hancock, I greatly value your feedback.  If you receive a survey following your visit with us today, we appreciate you taking the time to fill it out.  Thanks, [name] Sayward Horvath, CNM, WHNP-BC   Call the office [phone]) or go to [facility] if:  You begin to have strong, frequent contractions  Your water breaks.  Sometimes it is a big gush of fluid, sometimes it is just a trickle that keeps getting your panties wet or running down your legs  You have vaginal bleeding.  It is normal to have a small amount of spotting if your cervix was checked.   You don't feel your baby moving like normal.  If you don't, get you something to eat and drink and lay down and focus on feeling your baby move.  You should feel at least 10 movements in 2 hours.  If you don't, you should call the office or go to [facility].    Call the office [phone]) or go to [facility] for these signs of pre-eclampsia:  Severe headache that does not go away with Tylenol  Visual changes- seeing spots, double, blurred vision  Pain under your right breast or upper abdomen that does not go away with Tums or heartburn medicine  Nausea and/or vomiting  Severe swelling in your hands, feet, and face      Preterm Labor and Birth Information The normal length of a pregnancy is 39-41 weeks. Preterm labor is when labor starts before 37 completed weeks of pregnancy. What are the risk factors for preterm labor? Preterm labor is more likely to occur in women who:  Have certain infections during pregnancy such as a bladder infection, sexually transmitted infection, or infection inside the uterus (chorioamnionitis).  Have a shorter-than-normal cervix.  Have gone into preterm labor before.  Have had surgery on their cervix.  Are younger than age 24 or older than age 24.  Are African American.  Are pregnant with twins or multiple babies (multiple gestation).  Take street drugs or smoke while  pregnant.  Do not gain enough weight while pregnant.  Became pregnant shortly after having been pregnant.  What are the symptoms of preterm labor? Symptoms of preterm labor include:  Cramps similar to those that can happen during a menstrual period. The cramps may happen with diarrhea.  Pain in the abdomen or lower back.  Regular uterine contractions that may feel like tightening of the abdomen.  A feeling of increased pressure in the pelvis.  Increased watery or bloody mucus discharge from the vagina.  Water breaking (ruptured amniotic sac).  Why is it important to recognize signs of preterm labor? It is important to recognize signs of preterm labor because babies who are born prematurely may not be fully developed. This can put them at an increased risk for:  Long-term (chronic) heart and lung problems.  Difficulty immediately after birth with regulating body systems, including blood sugar, body temperature, heart rate, and breathing rate.  Bleeding in the brain.  Cerebral palsy.  Learning difficulties.  Death.  These risks are highest for babies who are born before 34 weeks of pregnancy. How is preterm labor treated? Treatment depends on the length of your pregnancy, your condition, and the health of your baby. It may involve:  Having a stitch (suture) placed in your cervix to prevent your cervix from opening too early (cerclage).  Taking or being given medicines, such as: ? Hormone medicines. These may be given early in pregnancy to help support  the pregnancy. ? Medicine to stop contractions. ? Medicines to help mature the baby's lungs. These may be prescribed if the risk of delivery is high. ? Medicines to prevent your baby from developing cerebral palsy.  If the labor happens before 34 weeks of pregnancy, you may need to stay in the hospital. What should I do if I think I am in preterm labor? If you think that you are going into preterm labor, call your health  care provider right away. How can I prevent preterm labor in future pregnancies? To increase your chance of having a full-term pregnancy:  Do not use any tobacco products, such as cigarettes, chewing tobacco, and e-cigarettes. If you need help quitting, ask your health care provider.  Do not use street drugs or medicines that have not been prescribed to you during your pregnancy.  Talk with your health care provider before taking any herbal supplements, even if you have been taking them regularly.  Make sure you gain a healthy amount of weight during your pregnancy.  Watch for infection. If you think that you might have an infection, get it checked right away.  Make sure to tell your health care provider if you have gone into preterm labor before.  This information is not intended to replace advice given to you by your health care provider. Make sure you discuss any questions you have with your health care provider. Document Released: 06/05/2003 Document Revised: 08/26/2015 Document Reviewed: 08/06/2015 Elsevier Interactive Patient Education  2018 [employer].

## 2017-01-22 LAB — COMPREHENSIVE METABOLIC PANEL
ALT: 13 IU/L (ref 0–32)
AST: 16 IU/L (ref 0–40)
Albumin/Globulin Ratio: 1.4 (ref 1.2–2.2)
Albumin: 3.4 g/dL — ABNORMAL LOW (ref 3.5–5.5)
Alkaline Phosphatase: 112 IU/L (ref 39–117)
BUN/Creatinine Ratio: 13 (ref 9–23)
BUN: 6 mg/dL (ref 6–20)
Bilirubin Total: <0.2 mg/dL (ref 0.0–1.2)
CO2: 20 mmol/L (ref 20–29)
Calcium: 8.6 mg/dL — ABNORMAL LOW (ref 8.7–10.2)
Chloride: 105 mmol/L (ref 96–106)
Creatinine, Ser: 0.48 mg/dL — ABNORMAL LOW (ref 0.57–1.00)
GFR calc Af Amer: 159 mL/min/{1.73_m2} (ref >59)
GFR calc non Af Amer: 138 mL/min/{1.73_m2} (ref >59)
Globulin, Total: 2.4 g/dL (ref 1.5–4.5)
Glucose: 81 mg/dL (ref 65–99)
Potassium: 3.7 mmol/L (ref 3.5–5.2)
Sodium: 140 mmol/L (ref 134–144)
Total Protein: 5.8 g/dL — ABNORMAL LOW (ref 6.0–8.5)

## 2017-01-22 LAB — CBC
Hematocrit: 32.4 % — ABNORMAL LOW (ref 34.0–46.6)
Hemoglobin: 10.6 g/dL — ABNORMAL LOW (ref 11.1–15.9)
MCH: 29.4 pg (ref 26.6–33.0)
MCHC: 32.7 g/dL (ref 31.5–35.7)
MCV: 90 fL (ref 79–97)
Platelets: 190 10*3/uL (ref 150–379)
RBC: 3.61 x10E6/uL — ABNORMAL LOW (ref 3.77–5.28)
RDW: 13.3 % (ref 12.3–15.4)
WBC: 10.3 10*3/uL (ref 3.4–10.8)

## 2017-01-22 LAB — PROTEIN / CREATININE RATIO, URINE
Creatinine, Urine: 103.4 mg/dL
Protein, Ur: 12.5 mg/dL
Protein/Creat Ratio: 121 mg/g creat (ref 0–200)

## 2017-01-24 ENCOUNTER — Encounter: Payer: Self-pay | Admitting: Women's Health

## 2017-01-24 ENCOUNTER — Other Ambulatory Visit: Payer: Self-pay | Admitting: Women's Health

## 2017-01-24 MED ORDER — FERROUS SULFATE 325 (65 FE) MG PO TABS: 325.0000 mg | ORAL_TABLET | Freq: Two times a day (BID) | ORAL | 3 refills | Status: DC

## 2017-01-25 ENCOUNTER — Telehealth: Payer: Self-pay | Admitting: Women's Health

## 2017-01-25 ENCOUNTER — Encounter (HOSPITAL_COMMUNITY): Payer: Self-pay | Admitting: *Deleted

## 2017-01-25 ENCOUNTER — Inpatient Hospital Stay (HOSPITAL_COMMUNITY)
Admission: AD | Admit: 2017-01-25 | Discharge: 2017-01-25 | Disposition: A | Discharge status: Home / Self Care | Payer: Medicaid Other | Source: Ambulatory Visit | Attending: Obstetrics and Gynecology | Admitting: Obstetrics and Gynecology

## 2017-01-25 DIAGNOSIS — O163 Unspecified maternal hypertension, third trimester: Secondary | ICD-10-CM | POA: Diagnosis not present

## 2017-01-25 DIAGNOSIS — R1010 Upper abdominal pain, unspecified: Secondary | ICD-10-CM | POA: Diagnosis not present

## 2017-01-25 DIAGNOSIS — R1030 Lower abdominal pain, unspecified: Secondary | ICD-10-CM | POA: Insufficient documentation

## 2017-01-25 DIAGNOSIS — R109 Unspecified abdominal pain: Secondary | ICD-10-CM

## 2017-01-25 DIAGNOSIS — M549 Dorsalgia, unspecified: Secondary | ICD-10-CM | POA: Insufficient documentation

## 2017-01-25 DIAGNOSIS — Z79899 Other long term (current) drug therapy: Secondary | ICD-10-CM | POA: Diagnosis not present

## 2017-01-25 DIAGNOSIS — O26893 Other specified pregnancy related conditions, third trimester: Secondary | ICD-10-CM

## 2017-01-25 DIAGNOSIS — Z3A34 34 weeks gestation of pregnancy: Secondary | ICD-10-CM | POA: Diagnosis not present

## 2017-01-25 LAB — URINALYSIS, ROUTINE W REFLEX MICROSCOPIC
Bilirubin Urine: NEGATIVE
Glucose, UA: NEGATIVE mg/dL
Hgb urine dipstick: NEGATIVE
Ketones, ur: 20 mg/dL — AB
Nitrite: NEGATIVE
Protein, ur: NEGATIVE mg/dL
Specific Gravity, Urine: 1.018 (ref 1.005–1.030)
pH: 6 (ref 5.0–8.0)

## 2017-01-25 NOTE — Telephone Encounter (Signed)
[facility]. She was 1 cm and not noted to have any contractions on the monitor so was sent home. When she got home, she went to the bathroom and noticed the toilet bowl was full of blood. She just went back to the bathroom, no blood was in the toilet and only had a little on her toilet paper. Informed patient that some spotting can occur after a cervical check but should not have heavy bleeding. Also states the baby has not moved since she got home but she has not been still. Advised patient to lay down and try to feel for movement and if no movement if felt, [facility] and also for the heavy vaginal bleeding. Verbalized understanding.

## 2017-01-25 NOTE — Telephone Encounter (Signed)
Patient called stating that she is 34 weeks preg and would like to speak with a nhurse or kim, pt states that she needs a call asap.

## 2017-01-25 NOTE — MAU Note (Addendum)
Pain started last night in lower abd like menstrual cramps. THen moved up to upper abd and is now from upper to lower abd. Also having a lot of lower back pain. Could not sleep last night. Stomach hurts worse with walking. Denies LOF or vag bleeding. STates lower abd is very tender to touch- even seat belt was uncomfortable

## 2017-01-25 NOTE — Discharge Instructions (Signed)

## 2017-01-25 NOTE — Telephone Encounter (Signed)
Called patient but VM full

## 2017-01-25 NOTE — MAU Provider Note (Signed)
History     CSN: [id number]  Arrival date and time: 01/25/17 1244   First Provider Initiated Contact with Patient 01/25/17 1324      Chief Complaint  Patient presents with  . Abdominal Pain  . Back Pain   HPI Shelly Hancock is a 26 y.o. G2P1001 at [redacted]w[redacted]d who presents with abdominal pain. Symptoms began last night. Reports lower abdominal pain that is constant. Also feels upper abdominal pain that is intermittent & radiates to low back. Feels abdominal tightening with pain. Rates pain 8/10. Has not treated. Had intercourse last night. Denies dysuria, n/v/d, constipation, vaginal bleeding, or vaginal discharge. Positive fetal movement.   OB History    Gravida Para Term Preterm AB Living   2 1 1  0 0 1   SAB TAB Ectopic Multiple Live Births   0 0 0 0 1      Past Medical History:  Diagnosis Date  . Eczema   . Headache(784.0)    Migraines  . Hypertension    with this pregnancy  . Postpartum depression   . Urinary tract infection   . Vaginal Pap smear, abnormal     Past Surgical History:  Procedure Laterality Date  . NO PAST SURGERIES      Family History  Problem Relation Age of Onset  . Heart attack Paternal Grandfather   . Diabetes Paternal Grandmother   . Cancer Maternal Grandmother        colon  . Heart attack Father   . Hypertension Mother   . Cancer Mother        uterine cancer  . Cancer Maternal Aunt        skin  . Cancer Maternal Uncle        lung cancer  . Other Neg Hx     Social History  Substance Use Topics  . Smoking status: Never Smoker  . Smokeless tobacco: Never Used  . Alcohol use No     Comment: not with preg    Allergies: No Known Allergies  Prescriptions Prior to Admission  Medication Sig Dispense Refill Last Dose  . Docosahexaenoic Acid (DHA COMPLETE PO) Take by mouth daily.    01/25/2017 at Unknown time  . ferrous sulfate 325 (65 FE) MG tablet Take 1 tablet (325 mg total) by mouth 2 (two) times daily with a meal. 60 tablet 3  01/25/2017 at Unknown time  . Prenatal Vit-Fe Fumarate-FA (PRENATAL MULTIVITAMIN) TABS Take 1 tablet by mouth daily. 30 tablet 6 01/25/2017 at Unknown time  . butalbital-acetaminophen-caffeine (FIORICET, ESGIC) 50-325-40 MG tablet Take 2 tablets by mouth every 6 (six) hours as needed for headache. 20 tablet 0 Taking  . omeprazole (PRILOSEC) 20 MG capsule 1 tablet a day (Patient taking differently: Prn) 30 capsule 6 Taking    Review of Systems  Constitutional: Negative.   Gastrointestinal: Positive for abdominal pain. Negative for constipation, diarrhea, nausea and vomiting.  Genitourinary: Negative.   Musculoskeletal: Positive for back pain.   Physical Exam   Blood pressure 115/69, pulse (!) 104, temperature 98.7 F (37.1 C), temperature source Oral, resp. rate 17, height 5\' 5"  (1.651 m), weight 236 lb (107 kg), last menstrual period 05/29/2016, SpO2 100 %, unknown if currently breastfeeding.  Physical Exam  Nursing note and vitals reviewed. Constitutional: She is oriented to person, place, and time. She appears well-developed and well-nourished. No distress.  HENT:  Head: Normocephalic and atraumatic.  Eyes: Conjunctivae are normal. Right eye exhibits no discharge. Left eye exhibits no discharge. No  scleral icterus.  Neck: Normal range of motion.  Respiratory: Effort normal. No respiratory distress.  GI: Soft. There is no tenderness.  Genitourinary:  Genitourinary Comments: Dilation: 1 Effacement (%): Thick Cervical Position: Posterior Station: -3 Exam by:: Judeth HornErin Dina Mobley NP   Neurological: She is alert and oriented to person, place, and time.  Skin: Skin is warm and dry. She is not diaphoretic.  Psychiatric: She has a normal mood and affect. Her behavior is normal. Judgment and thought content normal.   Fetal Tracing:  Baseline: 140 Variability: moderate Accelerations: 15x15 Decelerations: none  Toco: none MAU Course  Procedures Results for orders placed or performed  during the hospital encounter of 01/25/17 (from the past 24 hour(s))  Urinalysis, Routine w reflex microscopic     Status: Abnormal   Collection Time: 01/25/17 12:55 PM  Result Value Ref Range   Color, Urine YELLOW YELLOW   APPearance HAZY (A) CLEAR   Specific Gravity, Urine 1.018 1.005 - 1.030   pH 6.0 5.0 - 8.0   Glucose, UA NEGATIVE NEGATIVE mg/dL   Hgb urine dipstick NEGATIVE NEGATIVE   Bilirubin Urine NEGATIVE NEGATIVE   Ketones, ur 20 (A) NEGATIVE mg/dL   Protein, ur NEGATIVE NEGATIVE mg/dL   Nitrite NEGATIVE NEGATIVE   Leukocytes, UA LARGE (A) NEGATIVE   RBC / HPF 0-5 0 - 5 RBC/hpf   WBC, UA 6-30 0 - 5 WBC/hpf   Bacteria, UA RARE (A) NONE SEEN   Squamous Epithelial / LPF 6-30 (A) NONE SEEN   Mucus PRESENT    Sperm, UA PRESENT     MDM Reactive NST No ctx noted on monitor Cervix 1/thick/posterior -- states she was dilated 1 cm last week  Assessment and Plan  A: 1. Abdominal pain during pregnancy in third trimester   2. [redacted] weeks gestation of pregnancy    P: Discharge home PTL precautions Increase water intake F/u with OB Discussed reasons to return to [facility]U  Judeth HornErin Mahogany Torrance 01/25/2017, 1:25 PM

## 2017-01-26 ENCOUNTER — Encounter: Payer: Self-pay | Admitting: Women's Health

## 2017-01-28 ENCOUNTER — Encounter: Payer: Self-pay | Admitting: Women's Health

## 2017-01-28 ENCOUNTER — Ambulatory Visit (INDEPENDENT_AMBULATORY_CARE_PROVIDER_SITE_OTHER): Payer: 59 | Admitting: Women's Health

## 2017-01-28 VITALS — BP 110/70 | HR 98 | Wt 236.0 lb

## 2017-01-28 DIAGNOSIS — Z3A34 34 weeks gestation of pregnancy: Secondary | ICD-10-CM

## 2017-01-28 DIAGNOSIS — Z3483 Encounter for supervision of other normal pregnancy, third trimester: Secondary | ICD-10-CM

## 2017-01-28 DIAGNOSIS — Z1389 Encounter for screening for other disorder: Secondary | ICD-10-CM

## 2017-01-28 DIAGNOSIS — Z331 Pregnant state, incidental: Secondary | ICD-10-CM

## 2017-01-28 LAB — POCT URINALYSIS DIPSTICK
Blood, UA: NEGATIVE
Glucose, UA: NEGATIVE
Leukocytes, UA: NEGATIVE
Nitrite, UA: NEGATIVE

## 2017-01-28 NOTE — Progress Notes (Signed)
LOW-RISK PREGNANCY VISIT Patient name: Shelly Hancock MRN [id number]  Date of birth: 10/26/1990 Chief Complaint:   Routine Prenatal Visit  History of Present Illness:   Shelly Hancock is a 26 y.o. 132P1001 female at 5246w6d with an Estimated Date of Delivery: 03/05/17 being seen today for ongoing management of a low-risk pregnancy.  Today she reports bp's at work 138-153/93-106, never checks at home. Denies ha, visual changes, ruq/epigastric pain, n/v.  Pre-e labs normal last week. [facility] to [facility] d/t abd pain, bp there 115/69, cx was 1/th/-3. States SVE was very painful and she started having bright red bleeding w/ clots on the way home, none since. Some uc's yesterday, none this am.  Contractions: Not present.  .  Movement: Present. denies leaking of fluid. Review of Systems:   Pertinent items are noted in HPI Denies abnormal vaginal discharge w/ itching/odor/irritation, headaches, visual changes, shortness of breath, chest pain, abdominal pain, severe nausea/vomiting, or problems with urination or bowel movements unless otherwise stated above. Pertinent History Reviewed:  Reviewed past medical,surgical, social, obstetrical and family history.  Reviewed problem list, medications and allergies. Physical Assessment:   Vitals:   01/28/17 0904 01/28/17 0914 01/28/17 0935  BP: 140/80 110/60 110/70  Pulse: 98    Weight: 236 lb (107 kg)    Body mass index is 39.27 kg/m.   BP rechecked by myself, Lt arm 110/70, Rt arm 120/70        Physical Examination:   General appearance: Well appearing, and in no distress  Mental status: Alert, oriented to person, place, and time  Skin: Warm & dry  Cardiovascular: Normal heart rate noted  Respiratory: Normal respiratory effort, no distress  Abdomen: Soft, gravid, nontender  Pelvic: Cervical exam deferred         Extremities: Edema: Trace, DTRs 2+, no clonus  Fetal Status: Fetal Heart Rate (bpm): 155 Fundal Height: 34 cm Movement: Present     Results for orders placed or performed in visit on 01/28/17 (from the past 24 hour(s))  POCT urinalysis dipstick   Collection Time: 01/28/17  9:12 AM  Result Value Ref Range   Color, UA     Clarity, UA     Glucose, UA neg    Bilirubin, UA     Ketones, UA small    Spec Grav, UA  1.010 - 1.025   Blood, UA neg    pH, UA  5.0 - 8.0   Protein, UA trace    Urobilinogen, UA  0.2 or 1.0 E.U./dL   Nitrite, UA neg    Leukocytes, UA Negative Negative    Assessment & Plan:  1) Low-risk pregnancy G2P1001 at 4346w6d with an Estimated Date of Delivery: 03/05/17   2) One elevated bp today, w/ 3 subsequent completely normal bp's, normal bp 10/30 in [facility], asymptomatic, normal pre-e labs last week. Elevated bp's she is taking at work- hasn't taken any at home. To take bp QID over weekend/not at work, then again Monday at work and bring log w/ her on Tues. Reviewed pre-e s/s, reasons to seek care   Labs/procedures today: none  Plan:  Continue routine obstetrical care   Reviewed: Preterm labor symptoms and general obstetric precautions including but not limited to vaginal bleeding, contractions, leaking of fluid and fetal movement were reviewed in detail with the patient.  All questions were answered  Follow-up: Return in about 4 days (around 02/01/2017) for LROB.  Orders Placed This Encounter  Procedures  . POCT urinalysis dipstick  Marge Duncans CNM, [facility] 01/28/2017 9:40 AM

## 2017-01-28 NOTE — Patient Instructions (Signed)
Check your blood pressure 4 times daily at home, bring log to your next appt  Shelly Hancock, I greatly value your feedback.  If you receive a survey following your visit with us today, we appreciate you taking the time to fill it ouLynnell Hancock.  Thanks, Shelly HaffKim Khaleem Hancock, CNM, WHNP-BC   Call the office [phone]) or go to [facility] if:  You begin to have strong, frequent contractions  Your water breaks.  Sometimes it is a big gush of fluid, sometimes it is just a trickle that keeps getting your panties wet or running down your legs  You have vaginal bleeding.  It is normal to have a small amount of spotting if your cervix was checked.   You don't feel your baby moving like normal.  If you don't, get you something to eat and drink and lay down and focus on feeling your baby move.  You should feel at least 10 movements in 2 hours.  If you don't, you should call the office or [facility] to [facility].   Call the office [phone]) or go to [facility] for these signs of pre-eclampsia:  Severe headache that does not go away with Tylenol  Visual changes- seeing spots, double, blurred vision  Pain under your right breast or upper abdomen that does not go away with Tums or heartburn medicine  Nausea and/or vomiting  Severe swelling in your hands, feet, and face      Preterm Labor and Birth Information The normal length of a pregnancy is 39-41 weeks. Preterm labor is when labor starts before 37 completed weeks of pregnancy. What are the risk factors for preterm labor? Preterm labor is more likely to occur in women who:  Have certain infections during pregnancy such as a bladder infection, sexually transmitted infection, or infection inside the uterus (chorioamnionitis).  Have a shorter-than-normal cervix.  Have gone into preterm labor before.  Have had surgery on their cervix.  Are younger than age 24 or older than age 24.  Are African American.  Are pregnant with twins or multiple  babies (multiple gestation).  Take street drugs or smoke while pregnant.  Do not gain enough weight while pregnant.  Became pregnant shortly after having been pregnant.  What are the symptoms of preterm labor? Symptoms of preterm labor include:  Cramps similar to those that can happen during a menstrual period. The cramps may happen with diarrhea.  Pain in the abdomen or lower back.  Regular uterine contractions that may feel like tightening of the abdomen.  A feeling of increased pressure in the pelvis.  Increased watery or bloody mucus discharge from the vagina.  Water breaking (ruptured amniotic sac).  Why is it important to recognize signs of preterm labor? It is important to recognize signs of preterm labor because babies who are born prematurely may not be fully developed. This can put them at an increased risk for:  Long-term (chronic) heart and lung problems.  Difficulty immediately after birth with regulating body systems, including blood sugar, body temperature, heart rate, and breathing rate.  Bleeding in the brain.  Cerebral palsy.  Learning difficulties.  Death.  These risks are highest for babies who are born before 34 weeks of pregnancy. How is preterm labor treated? Treatment depends on the length of your pregnancy, your condition, and the health of your baby. It may involve:  Having a stitch (suture) placed in your cervix to prevent your cervix from opening too early (cerclage).  Taking or being given medicines,  such as: ? Hormone medicines. These may be given early in pregnancy to help support the pregnancy. ? Medicine to stop contractions. ? Medicines to help mature the baby's lungs. These may be prescribed if the risk of delivery is high. ? Medicines to prevent your baby from developing cerebral palsy.  If the labor happens before 34 weeks of pregnancy, you may need to stay in the hospital. What should I do if I think I am in preterm labor? If  you think that you are going into preterm labor, call your health care provider right away. How can I prevent preterm labor in future pregnancies? To increase your chance of having a full-term pregnancy:  Do not use any tobacco products, such as cigarettes, chewing tobacco, and e-cigarettes. If you need help quitting, ask your health care provider.  Do not use street drugs or medicines that have not been prescribed to you during your pregnancy.  Talk with your health care provider before taking any herbal supplements, even if you have been taking them regularly.  Make sure you gain a healthy amount of weight during your pregnancy.  Watch for infection. If you think that you might have an infection, get it checked right away.  Make sure to tell your health care provider if you have gone into preterm labor before.  This information is not intended to replace advice given to you by your health care provider. Make sure you discuss any questions you have with your health care provider. Document Released: 06/05/2003 Document Revised: 08/26/2015 Document Reviewed: 08/06/2015 Elsevier Interactive Patient Education  2018 [employer].

## 2017-01-30 ENCOUNTER — Encounter: Payer: Self-pay | Admitting: Women's Health

## 2017-02-01 ENCOUNTER — Encounter: Payer: 59 | Admitting: Obstetrics & Gynecology

## 2017-02-03 ENCOUNTER — Encounter (HOSPITAL_COMMUNITY): Payer: Self-pay

## 2017-02-03 ENCOUNTER — Inpatient Hospital Stay (HOSPITAL_COMMUNITY)
Admission: AD | Admit: 2017-02-03 | Discharge: 2017-02-03 | Disposition: A | Discharge status: Home / Self Care | Payer: Medicaid Other | Source: Ambulatory Visit | Attending: Obstetrics & Gynecology | Admitting: Obstetrics & Gynecology

## 2017-02-03 DIAGNOSIS — R109 Unspecified abdominal pain: Secondary | ICD-10-CM | POA: Diagnosis not present

## 2017-02-03 DIAGNOSIS — E860 Dehydration: Secondary | ICD-10-CM | POA: Insufficient documentation

## 2017-02-03 DIAGNOSIS — O26893 Other specified pregnancy related conditions, third trimester: Secondary | ICD-10-CM

## 2017-02-03 DIAGNOSIS — O26899 Other specified pregnancy related conditions, unspecified trimester: Secondary | ICD-10-CM

## 2017-02-03 DIAGNOSIS — Z3483 Encounter for supervision of other normal pregnancy, third trimester: Secondary | ICD-10-CM

## 2017-02-03 DIAGNOSIS — Z3A35 35 weeks gestation of pregnancy: Secondary | ICD-10-CM | POA: Diagnosis not present

## 2017-02-03 DIAGNOSIS — Z3689 Encounter for other specified antenatal screening: Secondary | ICD-10-CM

## 2017-02-03 LAB — URINALYSIS, ROUTINE W REFLEX MICROSCOPIC
Bilirubin Urine: NEGATIVE
Glucose, UA: NEGATIVE mg/dL
Hgb urine dipstick: NEGATIVE
Ketones, ur: 15 mg/dL — AB
Nitrite: NEGATIVE
Protein, ur: NEGATIVE mg/dL
Specific Gravity, Urine: 1.01 (ref 1.005–1.030)
pH: 6.5 (ref 5.0–8.0)

## 2017-02-03 LAB — URINALYSIS, MICROSCOPIC (REFLEX)

## 2017-02-03 NOTE — Discharge Instructions (Signed)

## 2017-02-03 NOTE — MAU Note (Signed)
Pt reports she had been having lower abd cramping on and off since this morning 3-4 in 1 hour. Also c/o increased pelvic pressure. Good fetal movement. Has taken her b/p 2 times today and they where elevated. Headache on and off today.

## 2017-02-03 NOTE — MAU Provider Note (Signed)
History     CSN: [id number]  Arrival date and time: 02/03/17 1243   Chief Complaint  Patient presents with  . Abdominal Pain   G2P1001 @35 .5 wks here with cramping. Sx started this am. Cramps are 4-5 times an hr. No VB or LOF. Reports good FM. Also reports some elevated BPs at home 140-150/90-100, other BPs were normal. Occasional HAs that respond to Tylenol. No visual disturbances or RUQ pain.    OB History    Gravida Para Term Preterm AB Living   2 1 1  0 0 1   SAB TAB Ectopic Multiple Live Births   0 0 0 0 1      Past Medical History:  Diagnosis Date  . Eczema   . Headache(784.0)    Migraines  . Hypertension    with this pregnancy  . Postpartum depression   . Urinary tract infection   . Vaginal Pap smear, abnormal     Past Surgical History:  Procedure Laterality Date  . NO PAST SURGERIES      Family History  Problem Relation Age of Onset  . Heart attack Paternal Grandfather   . Diabetes Paternal Grandmother   . Cancer Maternal Grandmother        colon  . Heart attack Father   . Hypertension Mother   . Cancer Mother        uterine cancer  . Cancer Maternal Aunt        skin  . Cancer Maternal Uncle        lung cancer  . Other Neg Hx     Social History   Tobacco Use  . Smoking status: Never Smoker  . Smokeless tobacco: Never Used  Substance Use Topics  . Alcohol use: No    Comment: not with preg  . Drug use: No    Allergies: No Known Allergies  Medications Prior to Admission  Medication Sig Dispense Refill Last Dose  . butalbital-acetaminophen-caffeine (FIORICET, ESGIC) 50-325-40 MG tablet Take 2 tablets by mouth every 6 (six) hours as needed for headache. (Patient not taking: Reported on 01/28/2017) 20 tablet 0 Not Taking  . Docosahexaenoic Acid (DHA COMPLETE PO) Take by mouth daily.    Taking  . ferrous sulfate 325 (65 FE) MG tablet Take 1 tablet (325 mg total) by mouth 2 (two) times daily with a meal. 60 tablet 3 Taking  . omeprazole  (PRILOSEC) 20 MG capsule 1 tablet a day (Patient taking differently: Prn) 30 capsule 6 Taking  . Prenatal Vit-Fe Fumarate-FA (PRENATAL MULTIVITAMIN) TABS Take 1 tablet by mouth daily. 30 tablet 6 Taking    Review of Systems  Eyes: Negative for visual disturbance.  Gastrointestinal: Positive for abdominal pain.  Genitourinary: Negative.   Neurological: Negative for headaches.   Physical Exam   Blood pressure 129/82, pulse 99, temperature 98.5 F (36.9 C), resp. rate 18, height 5\' 6"  (1.676 m), weight 241 lb (109.3 kg), last menstrual period 05/29/2016, SpO2 100 %, unknown if currently breastfeeding.  Patient Vitals for the past 24 hrs:  BP Temp Pulse Resp SpO2 Height Weight  02/03/17 1418 - - - - 100 % - -  02/03/17 1417 - - - - 100 % - -  02/03/17 1416 129/82 - 99 - - - -  02/03/17 1413 - - - - 100 % - -  02/03/17 1403 - - - - 100 % - -  02/03/17 1401 137/86 - 99 - - - -  02/03/17 1331 125/83 98.5 F (36.9  C) 95 18 - 5\' 6"  (1.676 m) 241 lb (109.3 kg)    Physical Exam  Nursing note and vitals reviewed. Constitutional: She is oriented to person, place, and time. She appears well-developed and well-nourished.  HENT:  Head: Normocephalic and atraumatic.  Neck: Normal range of motion.  Respiratory: Effort normal. No respiratory distress.  GI: Soft. She exhibits no distension. There is no tenderness.  Gravid  Genitourinary:  Genitourinary Comments: Cervix 1/thick, vtx  Musculoskeletal: Normal range of motion.  Neurological: She is alert and oriented to person, place, and time.  Skin: Skin is warm and dry.  Psychiatric: She has a normal mood and affect.  EFM: 120 bpm, mod variability, + accels, no decels Toco: irregular  Results for orders placed or performed during the hospital encounter of 02/03/17 (from the past 24 hour(s))  Urinalysis, Routine w reflex microscopic     Status: Abnormal   Collection Time: 02/03/17  1:35 PM  Result Value Ref Range   Color, Urine YELLOW  YELLOW   APPearance HAZY (A) CLEAR   Specific Gravity, Urine 1.010 1.005 - 1.030   pH 6.5 5.0 - 8.0   Glucose, UA NEGATIVE NEGATIVE mg/dL   Hgb urine dipstick NEGATIVE NEGATIVE   Bilirubin Urine NEGATIVE NEGATIVE   Ketones, ur 15 (A) NEGATIVE mg/dL   Protein, ur NEGATIVE NEGATIVE mg/dL   Nitrite NEGATIVE NEGATIVE   Leukocytes, UA LARGE (A) NEGATIVE  Urinalysis, Microscopic (reflex)     Status: Abnormal   Collection Time: 02/03/17  1:35 PM  Result Value Ref Range   RBC / HPF 0-5 0 - 5 RBC/hpf   WBC, UA 0-5 0 - 5 WBC/hpf   Bacteria, UA FEW (A) NONE SEEN   Squamous Epithelial / LPF 6-30 (A) NONE SEEN   Mucus PRESENT    MAU Course  Procedures  MDM Labs ordered and reviewed. Normal BPs, pre-e labs not indicated today. No evidence of UTI or PTL. Stable for discharge home.  Assessment and Plan   1. [redacted] weeks gestation of pregnancy   2. Encounter for supervision of other normal pregnancy in third trimester   3. NST (non-stress test) reactive   4. Abdominal cramping affecting pregnancy   5. Dehydration    Discharge home Follow up in OB office as scheduled Pre-e precautions PTL precautions Tub soaks Increase hydration  Allergies as of 02/03/2017   No Known Allergies     Medication List    STOP taking these medications   butalbital-acetaminophen-caffeine 50-325-40 MG tablet Commonly known as:  FIORICET, ESGIC     TAKE these medications   DHA COMPLETE PO Take by mouth daily.   ferrous sulfate 325 (65 FE) MG tablet Take 1 tablet (325 mg total) by mouth 2 (two) times daily with a meal.   omeprazole 20 MG capsule Commonly known as:  PRILOSEC 1 tablet a day What changed:  additional instructions   prenatal multivitamin Tabs tablet Take 1 tablet by mouth daily.      Donette LarryMelanie Reana Chacko, CNM 02/03/2017, 2:19 PM

## 2017-02-08 ENCOUNTER — Encounter: Payer: Self-pay | Admitting: Women's Health

## 2017-02-08 ENCOUNTER — Ambulatory Visit (INDEPENDENT_AMBULATORY_CARE_PROVIDER_SITE_OTHER): Payer: 59 | Admitting: Women's Health

## 2017-02-08 VITALS — BP 138/82 | HR 98 | Wt 239.0 lb

## 2017-02-08 DIAGNOSIS — Z331 Pregnant state, incidental: Secondary | ICD-10-CM

## 2017-02-08 DIAGNOSIS — Z3A36 36 weeks gestation of pregnancy: Secondary | ICD-10-CM

## 2017-02-08 DIAGNOSIS — Z1389 Encounter for screening for other disorder: Secondary | ICD-10-CM

## 2017-02-08 DIAGNOSIS — O133 Gestational [pregnancy-induced] hypertension without significant proteinuria, third trimester: Secondary | ICD-10-CM | POA: Diagnosis not present

## 2017-02-08 DIAGNOSIS — O0993 Supervision of high risk pregnancy, unspecified, third trimester: Secondary | ICD-10-CM

## 2017-02-08 LAB — POCT URINALYSIS DIPSTICK
Glucose, UA: NEGATIVE
Ketones, UA: NEGATIVE
Leukocytes, UA: NEGATIVE
Nitrite, UA: NEGATIVE
Protein, UA: NEGATIVE

## 2017-02-08 NOTE — Treatment Plan (Signed)
Induction Assessment Scheduling Form Fax to [facility]:  [phone]  Lynnell JudeCenobia Hobbins                                                                                   DOB:  02/07/1991                                                            MRN:  [id number]                                                                     Phone #:   [phone]                         Provider:  [facility]  GP:  [id number]                                                            Estimated Date of Delivery: 03/05/17  Dating Criteria: LMP c/w 12wk u/s    Medical Indications for induction:  GHTN Admission Date/Time:  11/16 @[facility]  Gestational age on admission:  37.0   Filed Weights   02/08/17 1538  Weight: 239 lb (108.4 kg)   HIV:  Non-reactive (04/27 0000) GBS:  pos urine     Method of induction(proposed):  cytotec   Scheduling Provider Signature:  Marge DuncansBooker, Cassia Fein Randall, CNM                                            Today's Date:  02/08/2017

## 2017-02-08 NOTE — Progress Notes (Signed)
HIGH-RISK PREGNANCY VISIT Patient name: Shelly Hancock MRN [id number]  Date of birth: 10/22/1990 Chief Complaint:   Routine Prenatal Visit (left work early today due to high BP)  History of Present Illness:   Shelly Hancock is a 26 y.o. 362P1001 female at 1395w3d with an Estimated Date of Delivery: 03/05/17 being seen today for ongoing management of a high-risk pregnancy [facility] dx today after talking w/ Dr. Despina HiddenEure.  Today she reports earlier today at work had headache and felt nauseated, took bp and ws 150s/114, went home and took a nap, feels much better now and bp at home was normal. Has been checking QID bp's at home and getting 130s-140s/80-90s. Denies current headache, visual changes, ruq/epigastric pain, n/v. Contractions: Irregular. Vag. Bleeding: None.  Movement: Present. denies leaking of fluid.  Review of Systems:   Pertinent items are noted in HPI Denies abnormal vaginal discharge w/ itching/odor/irritation, headaches, visual changes, shortness of breath, chest pain, abdominal pain, severe nausea/vomiting, or problems with urination or bowel movements unless otherwise stated above. Pertinent History Reviewed:  Reviewed past medical,surgical, social, obstetrical and family history.  Reviewed problem list, medications and allergies. Physical Assessment:   Vitals:   02/08/17 1538  BP: 138/82  Pulse: 98  Weight: 239 lb (108.4 kg)  Body mass index is 38.58 kg/m.           Physical Examination:   General appearance: alert, well appearing, and in no distress  Mental status: alert, oriented to person, place, and time  Skin: warm & dry   Extremities: Edema: Trace DTRs 2+, no clonus   Cardiovascular: normal heart rate noted  Respiratory: normal respiratory effort, no distress  Abdomen: gravid, soft, non-tender  Pelvic: Cervical exam deferred         Fetal Status: Fetal Heart Rate (bpm): 120 Fundal Height: 36 cm Movement: Present    Fetal Surveillance Testing today:  nst  NST: FHR baseline 120 bpm, Variability: moderate, Accelerations:present, Decelerations:  Absent= Cat 1/Reactive     Results for orders placed or performed in visit on 02/08/17 (from the past 24 hour(s))  POCT urinalysis dipstick   Collection Time: 02/08/17  3:39 PM  Result Value Ref Range   Color, UA     Clarity, UA     Glucose, UA neg    Bilirubin, UA     Ketones, UA neg    Spec Grav, UA  1.010 - 1.025   Blood, UA trace    pH, UA  5.0 - 8.0   Protein, UA neg    Urobilinogen, UA  0.2 or 1.0 E.U./dL   Nitrite, UA neg    Leukocytes, UA Negative Negative    Assessment & Plan:  1) High-risk pregnancy G2P1001 at 295w3d with an Estimated Date of Delivery: 03/05/17   2) GHTN, dx today after discussing w/ [facility], based on home/work bp's, recommends IOL @ 37wks, scheduled for 11/16 @ [facility]. IOL form faxed via epic and orders placed. Discussed pre-e warning s/s to report/reasons to seek care  Labs/procedures today: pre-e labs  Treatment Plan:  IOL @ 37wks, 11/16 @[facility]   Reviewed: Preterm labor symptoms and general obstetric precautions including but not limited to vaginal bleeding, contractions, leaking of fluid and fetal movement were reviewed in detail with the patient.  All questions were answered.  Follow-up: Return for Friday for HROB, bpp/dopp u/s.  Orders Placed This Encounter  Procedures  . US FETAL BPP WO NON STRESS  . US UA Cord Doppler  . US OB  Follow Up  . CBC  . Comprehensive metabolic panel  . Protein / creatinine ratio, urine  . POCT urinalysis dipstick   Marge DuncansBooker, Antario Yasuda Randall CNM, [facility] 02/08/2017 5:03 PM   No u/s appt available for Thurs/Fri this week, discussed w/ [facility], ok for no u/s or visit prior to IOL on 11/16 @ [place]. F/U 11/26 for 1wk pp bp check Cheral MarkerKimberly R. Shelton Soler, CNM, [facility] 02/08/2017 5:14 PM

## 2017-02-08 NOTE — Patient Instructions (Addendum)
Shelly Hancock, I greatly value your feedback.  If you receive a survey following your visit with us today, we appreciate you taking the time to fill it out.  Thanks, Shelly Hancock, CNM, WHNP-BC   Call the office [phone]) or go to [facility] if:  You begin to have strong, frequent contractions  Your water breaks.  Sometimes it is a big gush of fluid, sometimes it is just a trickle that keeps getting your panties wet or running down your legs  You have vaginal bleeding.  It is normal to have a small amount of spotting if your cervix was checked.   You don't feel your baby moving like normal.  If you don't, get you something to eat and drink and lay down and focus on feeling your baby move.  You should feel at least 10 movements in 2 hours.  If you don't, you should call the office or go to [facility].    Call the office [phone]) or go to [facility] for these signs of pre-eclampsia:  Severe headache that does not go away with Tylenol  Visual changes- seeing spots, double, blurred vision  Pain under your right breast or upper abdomen that does not go away with Tums or heartburn medicine  Nausea and/or vomiting  Severe swelling in your hands, feet, and face       Braxton Hicks Contractions Contractions of the uterus can occur throughout pregnancy, but they are not always a sign that you are in labor. You may have practice contractions called Braxton Hicks contractions. These false labor contractions are sometimes confused with true labor. What are Shelly PeltonBraxton Hicks contractions? Braxton Hicks contractions are tightening movements that occur in the muscles of the uterus before labor. Unlike true labor contractions, these contractions do not result in opening (dilation) and thinning of the cervix. Toward the end of pregnancy (32-34 weeks), Braxton Hicks contractions can happen more often and may become stronger. These contractions are sometimes difficult to tell apart from  true labor because they can be very uncomfortable. You should not feel embarrassed if you go to the hospital with false labor. Sometimes, the only way to tell if you are in true labor is for your health care provider to look for changes in the cervix. The health care provider will do a physical exam and may monitor your contractions. If you are not in true labor, the exam should show that your cervix is not dilating and your water has not broken. If there are no prenatal problems or other health problems associated with your pregnancy, it is completely safe for you to be sent home with false labor. You may continue to have Braxton Hicks contractions until you go into true labor. How can I tell the difference between true labor and false labor?  Differences ? False labor ? Contractions last 30-70 seconds.: Contractions are usually shorter and not as strong as true labor contractions. ? Contractions become very regular.: Contractions are usually irregular. ? Discomfort is usually felt in the top of the uterus, and it spreads to the lower abdomen and low back.: Contractions are often felt in the front of the lower abdomen and in the groin. ? Contractions do not go away with walking.: Contractions may go away when you walk around or change positions while lying down. ? Contractions usually become more intense and increase in frequency.: Contractions get weaker and are shorter-lasting as time goes on. ? The cervix dilates and gets thinner.: The cervix usually does  not dilate or become thin. Follow these instructions at home:  Take over-the-counter and prescription medicines only as told by your health care provider.  Keep up with your usual exercises and follow other instructions from your health care provider.  Eat and drink lightly if you think you are going into labor.  If Braxton Hicks contractions are making you uncomfortable: ? Change your position from lying down or resting to walking, or  change from walking to resting. ? Sit and rest in a tub of warm water. ? Drink enough fluid to keep your urine clear or pale yellow. Dehydration may cause these contractions. ? Do slow and deep breathing several times an hour.  Keep all follow-up prenatal visits as told by your health care provider. This is important. Contact a health care provider if:  You have a fever.  You have continuous pain in your abdomen. Get help right away if:  Your contractions become stronger, more regular, and closer together.  You have fluid leaking or gushing from your vagina.  You pass blood-tinged mucus (bloody show).  You have bleeding from your vagina.  You have low back pain that you never had before.  You feel your baby's head pushing down and causing pelvic pressure.  Your baby is not moving inside you as much as it used to. Summary  Contractions that occur before labor are called Braxton Hicks contractions, false labor, or practice contractions.  Braxton Hicks contractions are usually shorter, weaker, farther apart, and less regular than true labor contractions. True labor contractions usually become progressively stronger and regular and they become more frequent.  Manage discomfort from [facility] contractions by changing position, resting in a warm bath, drinking plenty of water, or practicing deep breathing. This information is not intended to replace advice given to you by your health care provider. Make sure you discuss any questions you have with your health care provider. Document Released: 03/15/2005 Document Revised: 02/02/2016 Document Reviewed: 02/02/2016 Elsevier Interactive Patient Education  2017 [employer]

## 2017-02-09 ENCOUNTER — Other Ambulatory Visit: Payer: Self-pay | Admitting: Women's Health

## 2017-02-09 ENCOUNTER — Encounter: Payer: Self-pay | Admitting: Women's Health

## 2017-02-09 ENCOUNTER — Telehealth (HOSPITAL_COMMUNITY): Payer: Self-pay | Admitting: *Deleted

## 2017-02-09 LAB — CBC
Hematocrit: 34 % (ref 34.0–46.6)
Hemoglobin: 11.3 g/dL (ref 11.1–15.9)
MCH: 29.2 pg (ref 26.6–33.0)
MCHC: 33.2 g/dL (ref 31.5–35.7)
MCV: 88 fL (ref 79–97)
Platelets: 217 10*3/uL (ref 150–379)
RBC: 3.87 x10E6/uL (ref 3.77–5.28)
RDW: 13.9 % (ref 12.3–15.4)
WBC: 10.2 10*3/uL (ref 3.4–10.8)

## 2017-02-09 LAB — PROTEIN / CREATININE RATIO, URINE
Creatinine, Urine: 82.6 mg/dL
Protein, Ur: 8.9 mg/dL
Protein/Creat Ratio: 108 mg/g creat (ref 0–200)

## 2017-02-09 LAB — COMPREHENSIVE METABOLIC PANEL
ALT: 17 IU/L (ref 0–32)
AST: 18 IU/L (ref 0–40)
Albumin/Globulin Ratio: 1.4 (ref 1.2–2.2)
Albumin: 3.6 g/dL (ref 3.5–5.5)
Alkaline Phosphatase: 128 IU/L — ABNORMAL HIGH (ref 39–117)
BUN/Creatinine Ratio: 15 (ref 9–23)
BUN: 7 mg/dL (ref 6–20)
Bilirubin Total: <0.2 mg/dL (ref 0.0–1.2)
CO2: 21 mmol/L (ref 20–29)
Calcium: 9.3 mg/dL (ref 8.7–10.2)
Chloride: 105 mmol/L (ref 96–106)
Creatinine, Ser: 0.48 mg/dL — ABNORMAL LOW (ref 0.57–1.00)
GFR calc Af Amer: 159 mL/min/{1.73_m2} (ref >59)
GFR calc non Af Amer: 138 mL/min/{1.73_m2} (ref >59)
Globulin, Total: 2.5 g/dL (ref 1.5–4.5)
Glucose: 93 mg/dL (ref 65–99)
Potassium: 4.1 mmol/L (ref 3.5–5.2)
Sodium: 138 mmol/L (ref 134–144)
Total Protein: 6.1 g/dL (ref 6.0–8.5)

## 2017-02-09 NOTE — Telephone Encounter (Signed)
Preadmission screen  

## 2017-02-10 ENCOUNTER — Telehealth (HOSPITAL_COMMUNITY): Payer: Self-pay | Admitting: *Deleted

## 2017-02-10 ENCOUNTER — Encounter (HOSPITAL_COMMUNITY): Payer: Self-pay | Admitting: *Deleted

## 2017-02-10 NOTE — Telephone Encounter (Signed)
Preadmission screen  

## 2017-02-12 ENCOUNTER — Encounter (HOSPITAL_COMMUNITY): Payer: Self-pay

## 2017-02-12 ENCOUNTER — Inpatient Hospital Stay (HOSPITAL_COMMUNITY): Payer: Medicaid Other | Admitting: Anesthesiology

## 2017-02-12 ENCOUNTER — Encounter (HOSPITAL_COMMUNITY): Payer: Self-pay | Admitting: Anesthesiology

## 2017-02-12 ENCOUNTER — Other Ambulatory Visit: Payer: Self-pay

## 2017-02-12 ENCOUNTER — Inpatient Hospital Stay (HOSPITAL_COMMUNITY)
Admission: RE | Admit: 2017-02-12 | Discharge: 2017-02-14 | DRG: 998 | Disposition: A | Discharge status: Home / Self Care | Payer: Medicaid Other | Source: Ambulatory Visit | Attending: Obstetrics and Gynecology | Admitting: Obstetrics and Gynecology

## 2017-02-12 DIAGNOSIS — O9962 Diseases of the digestive system complicating childbirth: Secondary | ICD-10-CM | POA: Diagnosis present

## 2017-02-12 DIAGNOSIS — O99824 Streptococcus B carrier state complicating childbirth: Secondary | ICD-10-CM | POA: Diagnosis present

## 2017-02-12 DIAGNOSIS — K219 Gastro-esophageal reflux disease without esophagitis: Secondary | ICD-10-CM | POA: Diagnosis present

## 2017-02-12 DIAGNOSIS — O133 Gestational [pregnancy-induced] hypertension without significant proteinuria, third trimester: Secondary | ICD-10-CM | POA: Diagnosis present

## 2017-02-12 DIAGNOSIS — O134 Gestational [pregnancy-induced] hypertension without significant proteinuria, complicating childbirth: Principal | ICD-10-CM | POA: Diagnosis present

## 2017-02-12 DIAGNOSIS — Z3A37 37 weeks gestation of pregnancy: Secondary | ICD-10-CM | POA: Diagnosis not present

## 2017-02-12 DIAGNOSIS — D649 Anemia, unspecified: Secondary | ICD-10-CM | POA: Diagnosis present

## 2017-02-12 DIAGNOSIS — O9902 Anemia complicating childbirth: Secondary | ICD-10-CM | POA: Diagnosis present

## 2017-02-12 LAB — COMPREHENSIVE METABOLIC PANEL
ALT: 14 U/L (ref 14–54)
AST: 26 U/L (ref 15–41)
Albumin: 2.9 g/dL — ABNORMAL LOW (ref 3.5–5.0)
Alkaline Phosphatase: 122 U/L (ref 38–126)
Anion gap: 9 (ref 5–15)
BUN: 8 mg/dL (ref 6–20)
CO2: 19 mmol/L — ABNORMAL LOW (ref 22–32)
Calcium: 8.6 mg/dL — ABNORMAL LOW (ref 8.9–10.3)
Chloride: 105 mmol/L (ref 101–111)
Creatinine, Ser: 0.34 mg/dL — ABNORMAL LOW (ref 0.44–1.00)
GFR calc Af Amer: >60 mL/min (ref >60)
GFR calc non Af Amer: >60 mL/min (ref >60)
Glucose, Bld: 86 mg/dL (ref 65–99)
Potassium: 3.8 mmol/L (ref 3.5–5.1)
Sodium: 133 mmol/L — ABNORMAL LOW (ref 135–145)
Total Bilirubin: 0.3 mg/dL (ref 0.3–1.2)
Total Protein: 5.9 g/dL — ABNORMAL LOW (ref 6.5–8.1)

## 2017-02-12 LAB — CBC
HCT: 31.2 % — ABNORMAL LOW (ref 36.0–46.0)
HCT: 31.2 % — ABNORMAL LOW (ref 36.0–46.0)
HCT: 31.5 % — ABNORMAL LOW (ref 36.0–46.0)
Hemoglobin: 10.4 g/dL — ABNORMAL LOW (ref 12.0–15.0)
Hemoglobin: 10.5 g/dL — ABNORMAL LOW (ref 12.0–15.0)
Hemoglobin: 10.7 g/dL — ABNORMAL LOW (ref 12.0–15.0)
MCH: 29.8 pg (ref 26.0–34.0)
MCH: 30.1 pg (ref 26.0–34.0)
MCH: 30.1 pg (ref 26.0–34.0)
MCHC: 33.3 g/dL (ref 30.0–36.0)
MCHC: 33.7 g/dL (ref 30.0–36.0)
MCHC: 34 g/dL (ref 30.0–36.0)
MCV: 87.7 fL (ref 78.0–100.0)
MCV: 89.4 fL (ref 78.0–100.0)
MCV: 90.2 fL (ref 78.0–100.0)
Platelets: 152 10*3/uL (ref 150–400)
Platelets: 175 10*3/uL (ref 150–400)
Platelets: 185 10*3/uL (ref 150–400)
RBC: 3.46 MIL/uL — ABNORMAL LOW (ref 3.87–5.11)
RBC: 3.49 MIL/uL — ABNORMAL LOW (ref 3.87–5.11)
RBC: 3.59 MIL/uL — ABNORMAL LOW (ref 3.87–5.11)
RDW: 13.2 % (ref 11.5–15.5)
RDW: 13.3 % (ref 11.5–15.5)
RDW: 13.4 % (ref 11.5–15.5)
WBC: 11.6 10*3/uL — ABNORMAL HIGH (ref 4.0–10.5)
WBC: 16.2 10*3/uL — ABNORMAL HIGH (ref 4.0–10.5)
WBC: 9.2 10*3/uL (ref 4.0–10.5)

## 2017-02-12 LAB — TYPE AND SCREEN
ABO/RH(D): O POS
Antibody Screen: NEGATIVE

## 2017-02-12 LAB — ABO/RH: ABO/RH(D): O POS

## 2017-02-12 LAB — RPR: RPR Ser Ql: NONREACTIVE

## 2017-02-12 MED ORDER — TETANUS-DIPHTH-ACELL PERTUSSIS 5-2.5-18.5 LF-MCG/0.5 IM SUSP
0.5000 mL | Freq: Once | INTRAMUSCULAR | Status: AC
  Administered 2017-02-13: 0.5 mL via INTRAMUSCULAR

## 2017-02-12 MED ORDER — IBUPROFEN 600 MG PO TABS
600.0000 mg | ORAL_TABLET | Freq: Four times a day (QID) | ORAL | Status: DC
  Administered 2017-02-12 – 2017-02-13 (×4): 600 mg via ORAL
  Filled 2017-02-12 (×4): qty 1

## 2017-02-12 MED ORDER — EPHEDRINE 5 MG/ML INJ
10.0000 mg | INTRAVENOUS | Status: DC | PRN
  Filled 2017-02-12: qty 2

## 2017-02-12 MED ORDER — OXYCODONE-ACETAMINOPHEN 5-325 MG PO TABS: 1.0000 | ORAL_TABLET | ORAL | Status: DC | PRN

## 2017-02-12 MED ORDER — OXYTOCIN BOLUS FROM INFUSION
500.0000 mL | Freq: Once | INTRAVENOUS | Status: AC
  Administered 2017-02-12: 500 mL via INTRAVENOUS

## 2017-02-12 MED ORDER — ACETAMINOPHEN 325 MG PO TABS: 650.0000 mg | ORAL_TABLET | ORAL | Status: DC | PRN

## 2017-02-12 MED ORDER — HYDROXYZINE HCL 50 MG PO TABS
50.0000 mg | ORAL_TABLET | Freq: Four times a day (QID) | ORAL | Status: DC | PRN
  Filled 2017-02-12: qty 1

## 2017-02-12 MED ORDER — PRENATAL MULTIVITAMIN CH
1.0000 | ORAL_TABLET | Freq: Every day | ORAL | Status: DC
  Administered 2017-02-13: 1 via ORAL
  Filled 2017-02-12 (×2): qty 1

## 2017-02-12 MED ORDER — LACTATED RINGERS IV SOLN: 500.0000 mL | INTRAVENOUS | Status: DC | PRN

## 2017-02-12 MED ORDER — TERBUTALINE SULFATE 1 MG/ML IJ SOLN
0.2500 mg | Freq: Once | INTRAMUSCULAR | Status: DC | PRN
  Filled 2017-02-12: qty 1

## 2017-02-12 MED ORDER — LACTATED RINGERS IV SOLN
INTRAVENOUS | Status: DC
  Administered 2017-02-12 (×3): via INTRAVENOUS

## 2017-02-12 MED ORDER — WITCH HAZEL-GLYCERIN EX PADS: 1.0000 "application " | MEDICATED_PAD | CUTANEOUS | Status: DC | PRN

## 2017-02-12 MED ORDER — OXYCODONE-ACETAMINOPHEN 5-325 MG PO TABS: 2.0000 | ORAL_TABLET | ORAL | Status: DC | PRN

## 2017-02-12 MED ORDER — DIBUCAINE 1 % RE OINT
1.0000 | TOPICAL_OINTMENT | RECTAL | Status: DC | PRN
Start: 2017-02-12 — End: 2017-02-14

## 2017-02-12 MED ORDER — MISOPROSTOL 25 MCG QUARTER TABLET
25.0000 ug | ORAL_TABLET | ORAL | Status: DC | PRN
  Administered 2017-02-12: 25 ug via VAGINAL
  Filled 2017-02-12 (×2): qty 1

## 2017-02-12 MED ORDER — SENNOSIDES-DOCUSATE SODIUM 8.6-50 MG PO TABS
2.0000 | ORAL_TABLET | ORAL | Status: DC
  Administered 2017-02-13: 2 via ORAL
  Filled 2017-02-12 (×2): qty 2

## 2017-02-12 MED ORDER — COCONUT OIL OIL: 1.0000 "application " | TOPICAL_OIL | Status: DC | PRN

## 2017-02-12 MED ORDER — ZOLPIDEM TARTRATE 5 MG PO TABS: 5.0000 mg | ORAL_TABLET | Freq: Every evening | ORAL | Status: DC | PRN

## 2017-02-12 MED ORDER — ONDANSETRON HCL 4 MG/2ML IJ SOLN: 4.0000 mg | INTRAMUSCULAR | Status: DC | PRN

## 2017-02-12 MED ORDER — OXYTOCIN 40 UNITS IN LACTATED RINGERS INFUSION - SIMPLE MED
1.0000 m[IU]/min | INTRAVENOUS | Status: DC
  Administered 2017-02-12: 2 m[IU]/min via INTRAVENOUS
  Filled 2017-02-12: qty 1000

## 2017-02-12 MED ORDER — FENTANYL 2.5 MCG/ML BUPIVACAINE 1/10 % EPIDURAL INFUSION (WH - ANES)
14.0000 mL/h | INTRAMUSCULAR | Status: DC | PRN
  Administered 2017-02-12 (×2): 14 mL/h via EPIDURAL
  Filled 2017-02-12 (×2): qty 100

## 2017-02-12 MED ORDER — SIMETHICONE 80 MG PO CHEW: 80.0000 mg | CHEWABLE_TABLET | ORAL | Status: DC | PRN

## 2017-02-12 MED ORDER — ACETAMINOPHEN 325 MG PO TABS
650.0000 mg | ORAL_TABLET | ORAL | Status: DC | PRN
Start: 2017-02-12 — End: 2017-02-14

## 2017-02-12 MED ORDER — IBUPROFEN 600 MG PO TABS
600.0000 mg | ORAL_TABLET | Freq: Four times a day (QID) | ORAL | Status: DC
  Administered 2017-02-14 (×2): 600 mg via ORAL
  Filled 2017-02-12 (×3): qty 1

## 2017-02-12 MED ORDER — OXYTOCIN 40 UNITS IN LACTATED RINGERS INFUSION - SIMPLE MED
2.5000 [IU]/h | INTRAVENOUS | Status: DC
  Administered 2017-02-12: 2.5 [IU]/h via INTRAVENOUS

## 2017-02-12 MED ORDER — PENICILLIN G POTASSIUM 5000000 UNITS IJ SOLR
5.0000 10*6.[IU] | Freq: Once | INTRAMUSCULAR | Status: AC
  Administered 2017-02-12: 5 10*6.[IU] via INTRAVENOUS
  Filled 2017-02-12 (×2): qty 5

## 2017-02-12 MED ORDER — PHENYLEPHRINE 40 MCG/ML (10ML) SYRINGE FOR IV PUSH (FOR BLOOD PRESSURE SUPPORT)
80.0000 ug | PREFILLED_SYRINGE | INTRAVENOUS | Status: DC | PRN
  Administered 2017-02-12 (×2): 80 ug via INTRAVENOUS
  Filled 2017-02-12: qty 5

## 2017-02-12 MED ORDER — FLEET ENEMA 7-19 GM/118ML RE ENEM: 1.0000 | ENEMA | RECTAL | Status: DC | PRN

## 2017-02-12 MED ORDER — LIDOCAINE HCL (PF) 1 % IJ SOLN
INTRAMUSCULAR | Status: DC | PRN
  Administered 2017-02-12: 4 mL via EPIDURAL

## 2017-02-12 MED ORDER — DIPHENHYDRAMINE HCL 25 MG PO CAPS: 25.0000 mg | ORAL_CAPSULE | Freq: Four times a day (QID) | ORAL | Status: DC | PRN

## 2017-02-12 MED ORDER — LACTATED RINGERS IV SOLN
500.0000 mL | Freq: Once | INTRAVENOUS | Status: AC
  Administered 2017-02-12: 500 mL via INTRAVENOUS

## 2017-02-12 MED ORDER — PHENYLEPHRINE 40 MCG/ML (10ML) SYRINGE FOR IV PUSH (FOR BLOOD PRESSURE SUPPORT)
80.0000 ug | PREFILLED_SYRINGE | INTRAVENOUS | Status: DC | PRN
  Filled 2017-02-12: qty 10
  Filled 2017-02-12: qty 5

## 2017-02-12 MED ORDER — PENICILLIN G POT IN DEXTROSE 60000 UNIT/ML IV SOLN
3.0000 10*6.[IU] | INTRAVENOUS | Status: DC
  Administered 2017-02-12 (×3): 3 10*6.[IU] via INTRAVENOUS
  Filled 2017-02-12 (×9): qty 50

## 2017-02-12 MED ORDER — SOD CITRATE-CITRIC ACID 500-334 MG/5ML PO SOLN: 30.0000 mL | ORAL | Status: DC | PRN

## 2017-02-12 MED ORDER — LIDOCAINE HCL (PF) 1 % IJ SOLN
30.0000 mL | INTRAMUSCULAR | Status: DC | PRN
  Filled 2017-02-12: qty 30

## 2017-02-12 MED ORDER — LACTATED RINGERS IV SOLN: 500.0000 mL | Freq: Once | INTRAVENOUS | Status: DC

## 2017-02-12 MED ORDER — BENZOCAINE-MENTHOL 20-0.5 % EX AERO: 1.0000 "application " | INHALATION_SPRAY | CUTANEOUS | Status: DC | PRN

## 2017-02-12 MED ORDER — FENTANYL CITRATE (PF) 100 MCG/2ML IJ SOLN: 50.0000 ug | INTRAMUSCULAR | Status: DC | PRN

## 2017-02-12 MED ORDER — ONDANSETRON HCL 4 MG/2ML IJ SOLN
4.0000 mg | Freq: Four times a day (QID) | INTRAMUSCULAR | Status: DC | PRN
  Administered 2017-02-12: 4 mg via INTRAVENOUS
  Filled 2017-02-12: qty 2

## 2017-02-12 MED ORDER — ONDANSETRON HCL 4 MG PO TABS: 4.0000 mg | ORAL_TABLET | ORAL | Status: DC | PRN

## 2017-02-12 MED ORDER — DIPHENHYDRAMINE HCL 50 MG/ML IJ SOLN: 12.5000 mg | INTRAMUSCULAR | Status: DC | PRN

## 2017-02-12 NOTE — Progress Notes (Signed)
Shelly Hancock is a 26 y.o. G2P1001 at 5455w0d.  Subjective: Comfortable and in agreement with plan, epidural controlling pain  Objective: BP 122/67   Pulse 81   Temp 98.2 F (36.8 C) (Oral)   Resp 16   LMP 05/29/2016   SpO2 100%    FHT:  FHR: 140 bpm, variability: normal,  accelerations:  no,  decelerations:  no UC:   Q 2-303minutes,  Dilation: 4.5 Effacement (%): 50 Cervical Position: Middle Station: -2 Presentation: Vertex Exam by:: Tennis MustS. Russell, RN/J. Follmer, RN  Labs: Results for orders placed or performed during the hospital encounter of 02/12/17 (from the past 24 hour(s))  CBC     Status: Abnormal   Collection Time: 02/12/17 12:56 AM  Result Value Ref Range   WBC 9.2 4.0 - 10.5 K/uL   RBC 3.59 (L) 3.87 - 5.11 MIL/uL   Hemoglobin 10.7 (L) 12.0 - 15.0 g/dL   HCT 16.131.5 (L) 09.636.0 - 04.546.0 %   MCV 87.7 78.0 - 100.0 fL   MCH 29.8 26.0 - 34.0 pg   MCHC 34.0 30.0 - 36.0 g/dL   RDW 40.913.4 81.111.5 - 91.415.5 %   Platelets 185 150 - 400 K/uL  Type and screen [facility]     Status: None   Collection Time: 02/12/17 12:56 AM  Result Value Ref Range   ABO/RH(D) O POS    Antibody Screen NEG    Sample Expiration 02/15/2017   Comprehensive metabolic panel     Status: Abnormal   Collection Time: 02/12/17 12:56 AM  Result Value Ref Range   Sodium 133 (L) 135 - 145 mmol/L   Potassium 3.8 3.5 - 5.1 mmol/L   Chloride 105 101 - 111 mmol/L   CO2 19 (L) 22 - 32 mmol/L   Glucose, Bld 86 65 - 99 mg/dL   BUN 8 6 - 20 mg/dL   Creatinine, Ser 7.820.34 (L) 0.44 - 1.00 mg/dL   Calcium 8.6 (L) 8.9 - 10.3 mg/dL   Total Protein 5.9 (L) 6.5 - 8.1 g/dL   Albumin 2.9 (L) 3.5 - 5.0 g/dL   AST 26 15 - 41 U/L   ALT 14 14 - 54 U/L   Alkaline Phosphatase 122 38 - 126 U/L   Total Bilirubin 0.3 0.3 - 1.2 mg/dL   GFR calc non Af Amer >60 >60 mL/min   GFR calc Af Amer >60 >60 mL/min   Anion gap 9 5 - 15  ABO/Rh     Status: None   Collection Time: 02/12/17 12:56 AM  Result Value Ref Range   ABO/RH(D) O POS   CBC     Status: Abnormal   Collection Time: 02/12/17  8:37 AM  Result Value Ref Range   WBC 11.6 (H) 4.0 - 10.5 K/uL   RBC 3.49 (L) 3.87 - 5.11 MIL/uL   Hemoglobin 10.5 (L) 12.0 - 15.0 g/dL   HCT 95.631.2 (L) 21.336.0 - 08.646.0 %   MCV 89.4 78.0 - 100.0 fL   MCH 30.1 26.0 - 34.0 pg   MCHC 33.7 30.0 - 36.0 g/dL   RDW 57.813.3 46.911.5 - 62.915.5 %   Platelets 175 150 - 400 K/uL    Assessment / Plan: 2855w0d week IUP Labor: latent Fetal Wellbeing:  Category 1 Pain Control:  Well controlled Anticipated MOD:  SVD  Shelly Hancock, Shelly Denis, Shelly Hancock 02/12/2017 11:46 AM

## 2017-02-12 NOTE — Progress Notes (Addendum)
LABOR PROGRESS NOTE  Shelly Hancock is a [age] y.o. G2P1001 at 3932w0d  admitted for IOL for gHTN.  Subjective: Patient is doing well. Tolerated foley bulb placement. Patient also had one dose of cytotec.  Objective: BP 121/72   Pulse 86   Temp 98.3 F (36.8 C) (Oral)   Resp 18   LMP 05/29/2016  or  Vitals:   02/12/17 0127 02/12/17 0424  BP: 121/73 121/72  Pulse: 92 86  Resp: 18 18  Temp: 98.3 F (36.8 C) 98.3 F (36.8 C)  TempSrc: Oral Oral    Last SVE:0550 Dilation: 4.5 Effacement (%): 50 Cervical Position: Middle Station: -1 Presentation: Vertex Exam by:: Ileana LaddLexie Ament, RN  FHT: HR 120, mod variability, + accels, no decels  Labs: Lab Results  Component Value Date   WBC 9.2 02/12/2017   HGB 10.7 (L) 02/12/2017   HCT 31.5 (L) 02/12/2017   MCV 87.7 02/12/2017   PLT 185 02/12/2017    Patient Active Problem List   Diagnosis Date Noted  . Gestational hypertension, third trimester 02/12/2017  . Elevated BP without diagnosis of hypertension 11/18/2016  . GBS bacteriuria 10/15/2016  . Bilateral Choroid plexus cyst of fetus 10/15/2016  . History of postpartum depression 09/22/2016  . Supervision of normal pregnancy 09/22/2016    Assessment / Plan: 26 y.o. G2P1001 at 4632w0d here for IOL for gHTN.   Labor: Cytotec x1, FB placed @0415 ,  Last BP within normal limits Fetal Wellbeing:  Cat 1 Pain Control:  IV pain meds, epidural if desired Anticipated MOD:  SVD  Shelly NeighboursAbdoulaye Lavarius Doughten, MD 02/12/2017, 6:30 AM

## 2017-02-12 NOTE — Progress Notes (Signed)
Shelly Hancock is a 26 y.o. G2P1001 at 6522w0d.  Subjective: AROM ~1515, attempted by Dr. Parke SimmersBland, completed by Arita Missawson, CNM  Objective: BP (!) 102/54   Pulse (!) 118   Temp 98.7 F (37.1 C) (Oral)   Resp 18   LMP 05/29/2016   SpO2 100%    FHT:  FHR: 150 bpm, variability: 15-20,  accelerations:  n/a,  decelerations:  n/a UC:   Q 5-827minutes,  Dilation: 4.5 Effacement (%): 50 Cervical Position: Middle Station: -2 Presentation: Vertex Exam by: Carloyn Jaeger. Fran Mcree, CNM  Labs: Results for orders placed or performed during the hospital encounter of 02/12/17 (from the past 24 hour(s))  CBC     Status: Abnormal   Collection Time: 02/12/17 12:56 AM  Result Value Ref Range   WBC 9.2 4.0 - 10.5 K/uL   RBC 3.59 (L) 3.87 - 5.11 MIL/uL   Hemoglobin 10.7 (L) 12.0 - 15.0 g/dL   HCT 16.131.5 (L) 09.636.0 - 04.546.0 %   MCV 87.7 78.0 - 100.0 fL   MCH 29.8 26.0 - 34.0 pg   MCHC 34.0 30.0 - 36.0 g/dL   RDW 40.913.4 81.111.5 - 91.415.5 %   Platelets 185 150 - 400 K/uL  Type and screen [facility] [place] [facility]     Status: None   Collection Time: 02/12/17 12:56 AM  Result Value Ref Range   ABO/RH(D) O POS    Antibody Screen NEG    Sample Expiration 02/15/2017   Comprehensive metabolic panel     Status: Abnormal   Collection Time: 02/12/17 12:56 AM  Result Value Ref Range   Sodium 133 (L) 135 - 145 mmol/L   Potassium 3.8 3.5 - 5.1 mmol/L   Chloride 105 101 - 111 mmol/L   CO2 19 (L) 22 - 32 mmol/L   Glucose, Bld 86 65 - 99 mg/dL   BUN 8 6 - 20 mg/dL   Creatinine, Ser 7.820.34 (L) 0.44 - 1.00 mg/dL   Calcium 8.6 (L) 8.9 - 10.3 mg/dL   Total Protein 5.9 (L) 6.5 - 8.1 g/dL   Albumin 2.9 (L) 3.5 - 5.0 g/dL   AST 26 15 - 41 U/L   ALT 14 14 - 54 U/L   Alkaline Phosphatase 122 38 - 126 U/L   Total Bilirubin 0.3 0.3 - 1.2 mg/dL   GFR calc non Af Amer >60 >60 mL/min   GFR calc Af Amer >60 >60 mL/min   Anion gap 9 5 - 15  ABO/Rh     Status: None   Collection Time: 02/12/17 12:56 AM  Result Value Ref Range   ABO/RH(D) O  POS   CBC     Status: Abnormal   Collection Time: 02/12/17  8:37 AM  Result Value Ref Range   WBC 11.6 (H) 4.0 - 10.5 K/uL   RBC 3.49 (L) 3.87 - 5.11 MIL/uL   Hemoglobin 10.5 (L) 12.0 - 15.0 g/dL   HCT 95.631.2 (L) 21.336.0 - 08.646.0 %   MCV 89.4 78.0 - 100.0 fL   MCH 30.1 26.0 - 34.0 pg   MCHC 33.7 30.0 - 36.0 g/dL   RDW 57.813.3 46.911.5 - 62.915.5 %   Platelets 175 150 - 400 K/uL    Assessment / Plan: 4322w0d week IUP Labor: latent Fetal Wellbeing:  Category 1 Pain Control:  Well controlled Anticipated MOD:  SVD  Marthenia RollingBland, Scott, DO 02/12/2017 3:23 PM  I confirm that I have verified the information documented in the resident's note and that I have also personally reperformed the  physical exam and all medical decision making activities.  Raelyn MoraRolitta Mannat Benedetti, CNM  02/12/2017

## 2017-02-12 NOTE — Anesthesia Procedure Notes (Signed)
Epidural Patient location during procedure: OB Start time: 02/12/2017 9:23 AM End time: 02/12/2017 9:29 AM  Staffing Anesthesiologist: Shelton SilvasHollis, Gleb Mcguire D, MD Performed: anesthesiologist   Preanesthetic Checklist Completed: patient identified, site marked, surgical consent, pre-op evaluation, timeout performed, IV checked, risks and benefits discussed and monitors and equipment checked  Epidural Patient position: sitting Prep: ChloraPrep Patient monitoring: heart rate, continuous pulse ox and blood pressure Approach: midline Location: L3-L4 Injection technique: LOR saline  Needle:  Needle type: Tuohy  Needle gauge: 17 G Needle length: 9 cm Catheter type: closed end flexible Catheter size: 20 Guage Test dose: negative and 1.5% lidocaine  Assessment Events: blood not aspirated, injection not painful, no injection resistance and no paresthesia  Additional Notes LOR @ 5  Patient identified. Risks/Benefits/Options discussed with patient including but not limited to bleeding, infection, nerve damage, paralysis, failed block, incomplete pain control, headache, blood pressure changes, nausea, vomiting, reactions to medications, itching and postpartum back pain. Confirmed with bedside nurse the patient's most recent platelet count. Confirmed with patient that they are not currently taking any anticoagulation, have any bleeding history or any family history of bleeding disorders. Patient expressed understanding and wished to proceed. All questions were answered. Sterile technique was used throughout the entire procedure. Please see nursing notes for vital signs. Test dose was given through epidural catheter and negative prior to continuing to dose epidural or start infusion. Warning signs of high block given to the patient including shortness of breath, tingling/numbness in hands, complete motor block, or any concerning symptoms with instructions to call for help. Patient was given instructions  on fall risk and not to get out of bed. All questions and concerns addressed with instructions to call with any issues or inadequate analgesia.    Reason for block:procedure for pain

## 2017-02-12 NOTE — H&P (Signed)
LABOR AND DELIVERY ADMISSION HISTORY AND PHYSICAL NOTE  Shelly Hancock is a 26 y.o. female G2P1001 with IUP at 2560w0d by LMP presenting for IOL for gestational HTN.   She reports positive fetal movement. She denies leakage of fluid or vaginal bleeding.  Prenatal History/Complications:  Past Medical History: Past Medical History:  Diagnosis Date  . Eczema   . Headache(784.0)    Migraines  . Hypertension    with this pregnancy  . Postpartum depression   . Urinary tract infection   . Vaginal Pap smear, abnormal     Past Surgical History: Past Surgical History:  Procedure Laterality Date  . NO PAST SURGERIES      Obstetrical History: OB History    Gravida Para Term Preterm AB Living   2 1 1  0 0 1   SAB TAB Ectopic Multiple Live Births   0 0 0 0 1      Social History: Social History   Socioeconomic History  . Marital status: Single    Spouse name: None  . Number of children: None  . Years of education: None  . Highest education level: None  Social Needs  . Financial resource strain: None  . Food insecurity - worry: None  . Food insecurity - inability: None  . Transportation needs - medical: None  . Transportation needs - non-medical: None  Occupational History  . None  Tobacco Use  . Smoking status: Never Smoker  . Smokeless tobacco: Never Used  Substance and Sexual Activity  . Alcohol use: No    Comment: not with preg  . Drug use: No  . Sexual activity: Yes    Birth control/protection: None  Other Topics Concern  . None  Social History Narrative  . None    Family History: Family History  Problem Relation Age of Onset  . Diabetes Paternal Grandmother   . Heart disease Paternal Grandmother   . Cancer Maternal Grandmother        colon  . Heart attack Father   . Hypertension Mother   . Cancer Maternal Aunt        skin  . Cancer Maternal Uncle        lung cancer  . Other Neg Hx     Allergies: No Known Allergies  Medications Prior to  Admission  Medication Sig Dispense Refill Last Dose  . ferrous sulfate 325 (65 FE) MG tablet Take 1 tablet (325 mg total) by mouth 2 (two) times daily with a meal. 60 tablet 3 Taking  . omeprazole (PRILOSEC) 20 MG capsule 1 tablet a day (Patient taking differently: Prn) 30 capsule 6 Taking  . Prenatal Vit-Fe Fumarate-FA (PRENATAL MULTIVITAMIN) TABS Take 1 tablet by mouth daily. 30 tablet 6 Taking     Review of Systems  All systems reviewed and negative except as stated in HPI  Physical Exam Blood pressure 121/72, pulse 86, temperature 98.3 F (36.8 C), temperature source Oral, resp. rate 18, last menstrual period 05/29/2016, unknown if currently breastfeeding. General appearance: alert, cooperative and no distress Lungs: clear to auscultation bilaterally Heart: regular rate and rhythm Abdomen: soft, non-tender; bowel sounds normal Extremities: No calf swelling or tenderness Presentation: cephalic by exam  Fetal monitoring: HR 130, mod variability, + accels, no decels Uterine activity: contractions every 4 min  Dilation: 1.5 Effacement (%): 50 Station: -2 Exam by:: E. Siska, rn   Prenatal labs: ABO, Rh: --/--/O POS (11/17 13240056) Antibody: PENDING (11/17 0056) Rubella: Immune (04/27 0000) RPR: Non Reactive (09/14 0844)  HBsAg: Negative (04/27 0000)  HIV: Non-reactive (04/27 0000)  GBS:  Positive  2 hr Glucola: 104 Genetic screening: Declined Anatomy US: Bilateral CP cysts resolved @25  weeks, otherwise normal  Prenatal Transfer Tool  Maternal Diabetes: No Genetic Screening: Declined Maternal Ultrasounds/Referrals: Normal Fetal Ultrasounds or other Referrals:  None Maternal Substance Abuse:  No Significant Maternal Medications:  Meds include: Other: Prilosec  and iron Supplementation Significant Maternal Lab Results: Lab values include: Other: anemia  Results for orders placed or performed during the hospital encounter of 02/12/17 (from the past 24 hour(s))  Type and  screen [facility]   Collection Time: 02/12/17 12:56 AM  Result Value Ref Range   ABO/RH(D) O POS    Antibody Screen PENDING    Sample Expiration 02/15/2017     Patient Active Problem List   Diagnosis Date Noted  . Gestational hypertension, third trimester 02/12/2017  . Elevated BP without diagnosis of hypertension 11/18/2016  . GBS bacteriuria 10/15/2016  . Bilateral Choroid plexus cyst of fetus 10/15/2016  . History of postpartum depression 09/22/2016  . Supervision of normal pregnancy 09/22/2016    Assessment: Shelly Hancock is a 26 y.o. G2P1001 at 239w0d here for IOL for gHTN.  #Labor: Cytotec, will place foley at next check. #Pain: IV pain meds as needed. #FWB: Cat 1 #ID:  GBS positive  #MOF: Breast #MOC: Nexplanon  #Circ:  Yes, [facility]  Abdoulaye Diallo 02/12/2017, 2:17 AM  OB FELLOW HISTORY AND PHYSICAL ATTESTATION  I have seen and examined this patient; I agree with above documentation in the resident's note.  --IOL for gHTN. BP here wnl. LFTs, Scr, and plt wnl. --Cervical ripening. Cytotec placed at 0130. FB placed at 0415 --FHT Cat I  Frederik PearJulie P Selene Peltzer, MD OB Fellow 02/12/2017, 4:25 AM

## 2017-02-12 NOTE — Anesthesia Pain Management Evaluation Note (Signed)
  CRNA Pain Management Visit Note  Patient: Shelly Hancock, 26 y.o., female  "Hello I am a [facility] of the anesthesia [facility]. We have an anesthesia team available at all times to provide care throughout the hospital, including epidural management and anesthesia for C-section. I don't know your plan for the delivery whether it a natural birth, water birth, IV sedation, nitrous supplementation, doula or epidural, but we want to meet your pain goals."   1.Was your pain managed to your expectations on prior hospitalizations?   Yes   2.What is your expectation for pain management during this hospitalization?     Epidural  3.How can we help you reach that goal? epidural  Record the patient's initial score and the patient's pain goal.   Pain: 0  Pain Goal: 4 [facility] wants you to be able to say your pain was always managed very well.  Lochlann Mastrangelo 02/12/2017

## 2017-02-12 NOTE — Progress Notes (Signed)
Patient ID: Shelly Hancock, female   DOB: 06/07/1990, [age] y.o.   MRN: [id number] Laboring down Vitals:   02/12/17 1901 02/12/17 1929 02/12/17 1931 02/12/17 2001  BP: (!) 112/39  118/63 127/72  Pulse: (!) 101  96 98  Resp: 16  16 16   Temp:  98.1 F (36.7 C)    TempSrc:  Oral    SpO2:       FHR stable with small variable decels with contractions  Dilation: 10 Dilation Complete Date: 02/12/17 Dilation Complete Time: 1949 Effacement (%): 100 Cervical Position: Middle Station: 0 Presentation: Vertex Exam by:: Herby AbrahamE. Siska, RN  Second stage of labor Anticipate SVD

## 2017-02-12 NOTE — Anesthesia Preprocedure Evaluation (Signed)
Anesthesia Evaluation    Reviewed: Allergy & Precautions, Patient's Chart, lab work & pertinent test results  Airway Mallampati: III       Dental   Pulmonary neg pulmonary ROS,    breath sounds clear to auscultation       Cardiovascular hypertension,  Rhythm:Regular Rate:Normal     Neuro/Psych  Headaches, PSYCHIATRIC DISORDERS Depression negative neurological ROS     GI/Hepatic Neg liver ROS, GERD  Medicated,  Endo/Other  negative endocrine ROS  Renal/GU negative Renal ROS     Musculoskeletal negative musculoskeletal ROS (+)   Abdominal   Peds  Hematology negative hematology ROS (+)   Anesthesia Other Findings Day of surgery medications reviewed with the patient.  Reproductive/Obstetrics (+) Pregnancy                             Anesthesia Physical Anesthesia Plan  ASA: III  Anesthesia Plan: Epidural   Post-op Pain Management:    Induction:   PONV Risk Score and Plan:   Airway Management Planned:   Additional Equipment:   Intra-op Plan:   Post-operative Plan:   Informed Consent: I have reviewed the patients History and Physical, chart, labs and discussed the procedure including the risks, benefits and alternatives for the proposed anesthesia with the patient or authorized representative who has indicated his/her understanding and acceptance.     Plan Discussed with:   Anesthesia Plan Comments:         Anesthesia Quick Evaluation

## 2017-02-13 MED ORDER — BENZOCAINE-MENTHOL 20-0.5 % EX AERO: 1.0000 "application " | INHALATION_SPRAY | CUTANEOUS | Status: DC | PRN

## 2017-02-13 MED ORDER — PRENATAL MULTIVITAMIN CH
1.0000 | ORAL_TABLET | Freq: Every day | ORAL | Status: DC
  Administered 2017-02-14: 1 via ORAL

## 2017-02-13 MED ORDER — SENNOSIDES-DOCUSATE SODIUM 8.6-50 MG PO TABS: 2.0000 | ORAL_TABLET | ORAL | Status: DC

## 2017-02-13 MED ORDER — TETANUS-DIPHTH-ACELL PERTUSSIS 5-2.5-18.5 LF-MCG/0.5 IM SUSP: 0.5000 mL | Freq: Once | INTRAMUSCULAR | Status: DC

## 2017-02-13 MED ORDER — DIPHENHYDRAMINE HCL 25 MG PO CAPS: 25.0000 mg | ORAL_CAPSULE | Freq: Four times a day (QID) | ORAL | Status: DC | PRN

## 2017-02-13 MED ORDER — DIBUCAINE 1 % RE OINT: 1.0000 "application " | TOPICAL_OINTMENT | RECTAL | Status: DC | PRN

## 2017-02-13 MED ORDER — SIMETHICONE 80 MG PO CHEW: 80.0000 mg | CHEWABLE_TABLET | ORAL | Status: DC | PRN

## 2017-02-13 MED ORDER — ONDANSETRON HCL 4 MG/2ML IJ SOLN
4.0000 mg | INTRAMUSCULAR | Status: DC | PRN
Start: 2017-02-13 — End: 2017-02-14

## 2017-02-13 MED ORDER — ZOLPIDEM TARTRATE 5 MG PO TABS: 5.0000 mg | ORAL_TABLET | Freq: Every evening | ORAL | Status: DC | PRN

## 2017-02-13 MED ORDER — COCONUT OIL OIL: 1.0000 "application " | TOPICAL_OIL | Status: DC | PRN

## 2017-02-13 MED ORDER — ACETAMINOPHEN 325 MG PO TABS: 650.0000 mg | ORAL_TABLET | ORAL | Status: DC | PRN

## 2017-02-13 MED ORDER — ONDANSETRON HCL 4 MG PO TABS: 4.0000 mg | ORAL_TABLET | ORAL | Status: DC | PRN

## 2017-02-13 MED ORDER — WITCH HAZEL-GLYCERIN EX PADS: 1.0000 "application " | MEDICATED_PAD | CUTANEOUS | Status: DC | PRN

## 2017-02-13 NOTE — Plan of Care (Signed)
POC discussed with pt.  Pt verbalizes understanding.

## 2017-02-13 NOTE — Progress Notes (Signed)
Post Partum Day 1 Subjective: no complaints, up ad lib, voiding and tolerating PO  Objective: Blood pressure 114/66, pulse 75, temperature 98.6 F (37 C), temperature source Oral, resp. rate 18, last menstrual period 05/29/2016, SpO2 98 %, unknown if currently breastfeeding.  Physical Exam:  General: alert, cooperative and no distress Lochia: appropriate Uterine Fundus: firm Incision: healing well DVT Evaluation: No evidence of DVT seen on physical exam.  Recent Labs    02/12/17 0837 02/12/17 2319  HGB 10.5* 10.4*  HCT 31.2* 31.2*    Assessment/Plan: Plan for discharge tomorrow Bottle feeding    LOS: 1 day   Wynelle BourgeoisMarie Williams 02/13/2017, 6:01 AM

## 2017-02-13 NOTE — Anesthesia Postprocedure Evaluation (Signed)
Anesthesia Post Note  Patient: Shelly Hancock  Procedure(s) Performed: AN AD HOC LABOR EPIDURAL     Patient location during evaluation: Mother Baby Anesthesia Type: Epidural Level of consciousness: awake and alert and oriented Pain management: satisfactory to patient Vital Signs Assessment: post-procedure vital signs reviewed and stable Respiratory status: spontaneous breathing and nonlabored ventilation Cardiovascular status: stable Postop Assessment: no headache, no backache, no signs of nausea or vomiting, adequate PO intake and patient able to bend at knees (patient up walking) Anesthetic complications: no    Last Vitals:  Vitals:   02/13/17 0045 02/13/17 0500  BP: 118/63 114/66  Pulse: 86 75  Resp: 18 18  Temp: 36.7 C 37 C  SpO2: 100% 98%    Last Pain:  Vitals:   02/13/17 0720  TempSrc:   PainSc: Asleep   Pain Goal: Patients Stated Pain Goal: 3 (02/13/17 0558)               Madison HickmanGREGORY,Gregg Holster

## 2017-02-13 NOTE — Progress Notes (Signed)
CSW acknowledges consult for hx of PPD. CSW met with [name] at bedside to further assess. Upon this writers arrival, [name] was warm and welcoming. CSW congratulated [name] on baby's arrival. [name] was thankful. CSW assessed [name]'s feelings since delivery. [name] notes she has been feeling great. CSW assessed hx of PPD and previous treatment methods. [name] confirms hx of PPD and notes she was prescribed Wellbutrin to treat which she notes worked well. CSW encouraged [name] to be mindful of her signs and symptoms over the next few weeks and to seek medical attention if she feels symptoms on set. [name] noted she will. CSW was also informed by [name] that she scored a 4 on her edinburgh scale. CSW thanked [name] for her time and being fourth coming. CSW has no concerns for discharge at this time.   Shelly Hancock, MSW, LCSW-A Clinical Social Worker  [facility]  Office: [phone]

## 2017-02-14 NOTE — Discharge Summary (Signed)
OB Discharge Summary     Patient Name: Shelly Hancock DOB: 04/03/1990 MRN: [id number]  Date of admission: 02/12/2017 Delivering MD: Wynelle BourgeoisWILLIAMS, MARIE   Date of discharge: 02/14/2017  Admitting diagnosis: 37 WK INDUCTION Intrauterine pregnancy: 8029w0d     Secondary diagnosis:  Active Problems:   Gestational hypertension, third trimester  Additional problems:      Discharge diagnosis: Term Pregnancy Delivered                                                                                                Post partum procedures:.  Augmentation: Pitocin  Complications: None  Hospital course:  Induction of Labor With Vaginal Delivery   26 y.o. yo [id number] at 7929w0d was admitted to the hospital 02/12/2017 for induction of labor.  Indication for induction: Gestational hypertension.  Patient had an uncomplicated labor course as follows: Membrane Rupture Time/Date: 3:15 PM ,02/12/2017   Intrapartum Procedures: Episiotomy: None [1]                                         Lacerations:  None [1]  Patient had delivery of a Viable infant.  Information for the patient's newborn:  Vilinda Boehringerhompson, Boy Guindaenobia [[id number]][[id number]]  Delivery Method: Vaginal, Spontaneous(Filed from Delivery Summary)   02/12/2017  Details of delivery can be found in separate delivery note.  Patient had a routine postpartum course. Patient is discharged home 02/14/17.  Physical exam  Vitals:   02/12/17 2345 02/13/17 0045 02/13/17 0500 02/14/17 0637  BP: 122/63 118/63 114/66 136/81  Pulse: 87 86 75 86  Resp: 18 18 18 18   Temp: 98.3 F (36.8 C) 98 F (36.7 C) 98.6 F (37 C) 98 F (36.7 C)  TempSrc: Oral Oral Oral Oral  SpO2: 99% 100% 98%    General: alert Lochia: appropriate Uterine Fundus: firm Incision: N/A DVT Evaluation: No evidence of DVT seen on physical exam. Labs: Lab Results  Component Value Date   WBC 16.2 (H) 02/12/2017   HGB 10.4 (L) 02/12/2017   HCT 31.2 (L) 02/12/2017   MCV 90.2 02/12/2017   PLT  152 02/12/2017   CMP Latest Ref Rng & Units 02/12/2017  Glucose 65 - 99 mg/dL 86  BUN 6 - 20 mg/dL 8  Creatinine 7.420.44 - 5.951.00 mg/dL 6.38(V0.34(L)  Sodium 564135 - 332145 mmol/L 133(L)  Potassium 3.5 - 5.1 mmol/L 3.8  Chloride 101 - 111 mmol/L 105  CO2 22 - 32 mmol/L 19(L)  Calcium 8.9 - 10.3 mg/dL 9.5(J8.6(L)  Total Protein 6.5 - 8.1 g/dL 5.9(L)  Total Bilirubin 0.3 - 1.2 mg/dL 0.3  Alkaline Phos 38 - 126 U/L 122  AST 15 - 41 U/L 26  ALT 14 - 54 U/L 14    Discharge instruction: per After Visit Summary and "Baby and Me Booklet".  After visit meds:  Allergies as of 02/14/2017   No Known Allergies     Medication List    TAKE these medications   ferrous sulfate 325 (65 FE) MG tablet Take 1  tablet (325 mg total) by mouth 2 (two) times daily with a meal.   omeprazole 20 MG capsule Commonly known as:  PRILOSEC 1 tablet a day What changed:    how much to take  how to take this  when to take this  reasons to take this  additional instructions   prenatal multivitamin Tabs tablet Take 1 tablet by mouth daily.       Diet: routine diet  Activity: Advance as tolerated. Pelvic rest for 6 weeks.   Outpatient follow up:1wk blood pressure check, 6wks f/u Follow up Appt: Future Appointments  Date Time Provider Department Center  02/21/2017  1:30 PM Cheral MarkerBooker, Kimberly R, CNM [facility]  03/15/2017 11:00 AM Cresenzo-Dishmon, Scarlette CalicoFrances, CNM [facility]   Follow up Visit:No Follow-up on file.  Postpartum contraception: Nexplanon  Newborn Data: Live born female  Birth Weight: 7 lb 7 oz (3374 g) APGAR: 8, 8  Newborn Delivery   Birth date/time:  02/12/2017 21:20:00 Delivery type:  Vaginal, Spontaneous     Baby Feeding: Breast Disposition:home with mother   02/14/2017 Marthenia RollingScott Bland, DO   I confirm that I have verified the information documented in the resident's note and that I have also personally reperformed the physical exam and all medical decision making  activities.  Rolm BookbinderCaroline M Marcene Laskowski, CNM 02/14/17 10:57 AM

## 2017-02-14 NOTE — Lactation Note (Signed)
This note was copied from a baby's chart. Lactation Consultation Note  Patient Name: Shelly Hancock ZOXWR'UToday's Date: 02/14/2017  [facility] feeding preference and per mom plans to only bottle feed  And not pump either. LC reviewed with mom how to dry up her milk with tight fitting bra,  Backwards shower, decrease stimulation, cold cabbage leaves until they wilt and then change  Them out and use them until the milk drys up.     Maternal Data    Feeding    LATCH Score                   Interventions    Lactation Tools Discussed/Used     Consult Status      Shelly Hancock 02/14/2017, 8:50 AM

## 2017-02-15 ENCOUNTER — Telehealth: Payer: Self-pay | Admitting: *Deleted

## 2017-02-15 NOTE — Telephone Encounter (Signed)
Pt called stating that she was having abdominal cramps after delivering on Saturday. She states that she has tried taking Tylenol and that is not helping. Advised pt to try taking Ibuprofen and to call us back if that did not help. Pt verbalized understanding.

## 2017-02-21 ENCOUNTER — Ambulatory Visit: Payer: 59 | Admitting: Obstetrics and Gynecology

## 2017-02-28 ENCOUNTER — Other Ambulatory Visit: Payer: Self-pay

## 2017-02-28 ENCOUNTER — Encounter: Payer: Self-pay | Admitting: Women's Health

## 2017-02-28 ENCOUNTER — Ambulatory Visit (INDEPENDENT_AMBULATORY_CARE_PROVIDER_SITE_OTHER): Payer: 59 | Admitting: Women's Health

## 2017-02-28 VITALS — BP 132/78 | HR 82 | Ht 64.0 in | Wt 221.0 lb

## 2017-02-28 DIAGNOSIS — Z0130 Encounter for examination of blood pressure without abnormal findings: Secondary | ICD-10-CM

## 2017-02-28 NOTE — Patient Instructions (Signed)
NO SEX UNTIL AFTER YOU GET YOUR BIRTH CONTROL  

## 2017-02-28 NOTE — Progress Notes (Signed)
   GYN VISIT Patient name: Lynnell JudeCenobia Simms MRN [id number]  Date of birth: 05/24/1990 Chief Complaint:   Blood Pressure Check  History of Present Illness:   Lynnell JudeCenobia Baumgartner is a 26 y.o. 222P2002 Caucasian female 2wks s/p SVB after IOL for GHTN, being seen today for bp check.  No bp meds. Bottlefeeding. Plans Nexplanon.   Patient's last menstrual period was 05/29/2016. The current method of family planning is abstinence. Review of Systems:   Pertinent items are noted in HPI Denies fever/chills, dizziness, headaches, visual disturbances, fatigue, shortness of breath, chest pain, abdominal pain, vomiting, abnormal vaginal discharge/itching/odor/irritation, problems with periods, bowel movements, urination, or intercourse unless otherwise stated above.  Pertinent History Reviewed:  Reviewed past medical,surgical, social, obstetrical and family history.  Reviewed problem list, medications and allergies. Physical Assessment:   Vitals:   02/28/17 1513  BP: 132/78  Pulse: 82  Weight: 221 lb (100.2 kg)  Height: 5\' 4"  (1.626 m)  Body mass index is 37.93 kg/m.       Physical Examination:   General appearance: alert, well appearing, and in no distress  Mental status: alert, oriented to person, place, and time  Skin: warm & dry   Cardiovascular: normal heart rate noted  Respiratory: normal respiratory effort, no distress  Abdomen: soft, non-tender   Pelvic: examination not indicated  Extremities: no edema   No results found for this or any previous visit (from the past 24 hour(s)).  Assessment & Plan:  1) 2wks s/p SVB after IOL for GHTN  2) BP check> normal bp  3) Bottlefeeding  4) Plans nexplanon, abstinence until insertion  No orders of the defined types were placed in this encounter.   Return for As scheduled 12/18 for pp visit. Please schedule 12/19 for nexplanon insertion.  Marge DuncansBooker, Charletha Dalpe Randall CNM, [facility] 02/28/2017 3:26 PM

## 2017-03-03 ENCOUNTER — Telehealth: Payer: Self-pay | Admitting: *Deleted

## 2017-03-03 NOTE — Telephone Encounter (Signed)
Pt c/o some bleeding when she has a bowel movement. She states her stool are soft and she doesn't strain. Informed pt that she could have hemorrhoids from delivery and she could try witch hazel and OTC hemorrhoid cream. Advised pt to call back if the bleeding worsened or if she felt that she needed to be seen sooner than her appt on the 18th. Pt verbalized understanding.

## 2017-03-15 ENCOUNTER — Ambulatory Visit (INDEPENDENT_AMBULATORY_CARE_PROVIDER_SITE_OTHER): Payer: 59 | Admitting: Advanced Practice Midwife

## 2017-03-15 ENCOUNTER — Encounter: Payer: Self-pay | Admitting: Advanced Practice Midwife

## 2017-03-15 NOTE — Progress Notes (Signed)
Shelly Hancock is a 26 y.o. who presents for a postpartum visit. She is 4 weeks postpartum following a spontaneous vaginal delivery. I have fully reviewed the prenatal and intrapartum course. The delivery was at 37 gestational weeks.IOL for GHTN No meds. Anesthesia: epidural. Postpartum course has been unevnetufl. Baby's course has been uneventful. Baby is feeding by bottle. Bleeding: no bleeding. Bowel function is normal. Bladder function is normal. Patient is not sexually active. Contraception method is abstinence. Postpartum depression screening: negative.   Current Outpatient Medications:  .  ferrous sulfate 325 (65 FE) MG tablet, Take 1 tablet (325 mg total) by mouth 2 (two) times daily with a meal. (Patient not taking: Reported on 03/15/2017), Disp: 60 tablet, Rfl: 3 .  omeprazole (PRILOSEC) 20 MG capsule, 1 tablet a day (Patient not taking: Reported on 02/28/2017), Disp: 30 capsule, Rfl: 6 .  Prenatal Vit-Fe Fumarate-FA (PRENATAL MULTIVITAMIN) TABS, Take 1 tablet by mouth daily. (Patient not taking: Reported on 02/28/2017), Disp: 30 tablet, Rfl: 6  Review of Systems   Constitutional: Negative for fever and chills Eyes: Negative for visual disturbances Respiratory: Negative for shortness of breath, dyspnea Cardiovascular: Negative for chest pain or palpitations  Gastrointestinal: Negative for vomiting, diarrhea and constipation Genitourinary: Negative for dysuria and urgency Musculoskeletal: Negative for back pain, joint pain, myalgias  Neurological: Negative for dizziness and headaches    Objective:     Vitals:   03/15/17 1111  BP: 120/70  Pulse: 98   General:  alert, cooperative and no distress   Breasts:  negative  Lungs: clear to auscultation bilaterally  Heart:  regular rate and rhythm  Abdomen: Soft, nontender   Vulva:  normal  Vagina: normal vagina  Cervix:  closed  Corpus: Well involuted     Rectal Exam: no hemorrhoids        Assessment:    normal postpartum  exam.  Plan:   1. Contraception: Nexplanon 2. Follow up in:  Tomorrow for nexplanon or as needed.  f

## 2017-03-16 ENCOUNTER — Ambulatory Visit (INDEPENDENT_AMBULATORY_CARE_PROVIDER_SITE_OTHER): Payer: 59 | Admitting: Advanced Practice Midwife

## 2017-03-16 ENCOUNTER — Encounter: Payer: Self-pay | Admitting: Advanced Practice Midwife

## 2017-03-16 VITALS — BP 120/80 | HR 98 | Ht 63.0 in | Wt 225.0 lb

## 2017-03-16 DIAGNOSIS — Z3202 Encounter for pregnancy test, result negative: Secondary | ICD-10-CM

## 2017-03-16 DIAGNOSIS — Z3046 Encounter for surveillance of implantable subdermal contraceptive: Secondary | ICD-10-CM | POA: Diagnosis not present

## 2017-03-16 DIAGNOSIS — Z30017 Encounter for initial prescription of implantable subdermal contraceptive: Secondary | ICD-10-CM | POA: Insufficient documentation

## 2017-03-16 LAB — POCT URINE PREGNANCY: Preg Test, Ur: NEGATIVE

## 2017-03-16 NOTE — Progress Notes (Signed)
  HPI:  Shelly Hancock is a 26 y.o. year old Caucasian female here for Nexplanon insertion.  She has been abstinent since delivery , and her pregnancy test today was negative.  Risks/benefits/side effects of Nexplanon have been discussed and her questions have been answered.  Specifically, a failure rate of 03/998 has been reported, with an increased failure rate if pt takes St. John's Wort and/or antiseizure medicaitons.  Shelly Hancock is aware of the common side effect of irregular bleeding, which the incidence of decreases over time.   Past Medical History: Past Medical History:  Diagnosis Date  . Eczema   . Headache(784.0)    Migraines  . Hypertension    with this pregnancy  . Postpartum depression   . Urinary tract infection   . Vaginal Pap smear, abnormal     Past Surgical History: Past Surgical History:  Procedure Laterality Date  . NO PAST SURGERIES      Family History: Family History  Problem Relation Age of Onset  . Diabetes Paternal Grandmother   . Heart disease Paternal Grandmother   . Cancer Maternal Grandmother        colon  . Heart attack Father   . Hypertension Mother   . Cancer Maternal Aunt        skin  . Cancer Maternal Uncle        lung cancer  . Other Neg Hx     Social History: Social History   Tobacco Use  . Smoking status: Never Smoker  . Smokeless tobacco: Never Used  Substance Use Topics  . Alcohol use: No    Comment: not with preg  . Drug use: No    Allergies: No Known Allergies    Her left arm, approximatly 4 inches proximal from the elbow, was cleansed with alcohol and anesthetized with 2cc of 2% Lidocaine.  The area was cleansed again and the Nexplanon was inserted without difficulty.  A pressure bandage was applied.  Pt was instructed to remove pressure bandage in a few hours, and keep insertion site covered with a bandaid for 3 days.  Back up contraception was recommended for 2 weeks.  Follow-up scheduled PRN  problems  CRESENZO-DISHMAN,Riven Mabile 03/16/2017 9:13 AM

## 2017-03-17 ENCOUNTER — Encounter: Payer: Self-pay | Admitting: Women's Health

## 2017-03-23 MED ORDER — ETONOGESTREL 68 MG ~~LOC~~ IMPL
68.0000 mg | DRUG_IMPLANT | Freq: Once | SUBCUTANEOUS | Status: AC
  Administered 2017-03-16: 68 mg via SUBCUTANEOUS

## 2017-03-23 NOTE — Addendum Note (Signed)
Addended by: Moss McRESENZO, LATISHA M on: 03/23/2017 04:05 PM   Modules accepted: Orders

## 2017-03-31 ENCOUNTER — Encounter: Payer: Self-pay | Admitting: Women's Health

## 2017-04-04 ENCOUNTER — Encounter: Payer: Self-pay | Admitting: *Deleted

## 2017-05-05 ENCOUNTER — Encounter: Payer: Self-pay | Admitting: Women's Health

## 2017-07-13 ENCOUNTER — Encounter: Payer: Self-pay | Admitting: Advanced Practice Midwife

## 2017-07-13 MED ORDER — MEGESTROL ACETATE 40 MG PO TABS: ORAL_TABLET | ORAL | 3 refills | Status: DC

## 2018-05-31 NOTE — Telephone Encounter (Signed)
Note sent to provider 

## 2018-06-28 ENCOUNTER — Other Ambulatory Visit: Payer: Self-pay | Admitting: Advanced Practice Midwife

## 2018-06-28 MED ORDER — MEGESTROL ACETATE 40 MG PO TABS: ORAL_TABLET | ORAL | 3 refills | Status: DC

## 2018-06-28 NOTE — Telephone Encounter (Signed)
Rx was printed, called into [facility]. Pt aware.

## 2018-06-28 NOTE — Telephone Encounter (Signed)
Patient called, scheduled her P&P for 08/30/18.  She is also requesting medication to stop the bleeding.  She said she needs refills  [facility]  [phone]

## 2018-08-30 ENCOUNTER — Other Ambulatory Visit: Payer: Medicaid Other | Admitting: Obstetrics and Gynecology

## 2018-09-08 ENCOUNTER — Encounter: Payer: Self-pay | Admitting: *Deleted

## 2018-09-11 ENCOUNTER — Other Ambulatory Visit: Payer: Medicaid Other | Admitting: Advanced Practice Midwife

## 2019-05-08 ENCOUNTER — Ambulatory Visit (INDEPENDENT_AMBULATORY_CARE_PROVIDER_SITE_OTHER): Payer: Medicaid Other | Admitting: Obstetrics and Gynecology

## 2019-05-08 ENCOUNTER — Encounter: Payer: Self-pay | Admitting: Obstetrics and Gynecology

## 2019-05-08 ENCOUNTER — Other Ambulatory Visit: Payer: Self-pay

## 2019-05-08 VITALS — BP 141/91 | HR 81 | Ht 65.0 in | Wt 259.0 lb

## 2019-05-08 DIAGNOSIS — Z3009 Encounter for other general counseling and advice on contraception: Secondary | ICD-10-CM | POA: Diagnosis not present

## 2019-05-08 DIAGNOSIS — Z302 Encounter for sterilization: Secondary | ICD-10-CM | POA: Insufficient documentation

## 2019-05-08 NOTE — Progress Notes (Signed)
Patient ID: Shelly Hancock, female   DOB: 08-06-1990, 29 y.o.   MRN: [id number]   [facility] Clinic Visit  @DATE @            Patient name: Shelly Hancock MRN Shelly Hancock  Date of birth: 24-Mar-1991  CC & HPI:  Bhavika Schnider is a 29 y.o. female presenting today for permanent contraception and discussion of tubal ligation. Her youngest in 27 years old and is currently on Implanon and has noticed significant weight gain. Her appetite changes around the time she gets her period for 2-3 days.  ROS:  ROS +weight gain -cramping  Pertinent History Reviewed:   Reviewed:  Medical         Past Medical History:  Diagnosis Date  . Eczema   . Headache(784.0)    Migraines  . Hypertension    with this pregnancy  . Postpartum depression   . Urinary tract infection   . Vaginal Pap smear, abnormal                               Surgical Hx:    Past Surgical History:  Procedure Laterality Date  . NO PAST SURGERIES     Medications: Reviewed & Updated - see associated section                       Current Outpatient Medications:  .  ferrous sulfate 325 (65 FE) MG tablet, Take 1 tablet (325 mg total) by mouth 2 (two) times daily with a meal. (Patient not taking: Reported on 03/15/2017), Disp: 60 tablet, Rfl: 3 .  megestrol (MEGACE) 40 MG tablet, Take 3/day (at the same time) for 5 days; 2/day for 5 days, then 1/day PO prn bleeding, Disp: 60 tablet, Rfl: 3 .  omeprazole (PRILOSEC) 20 MG capsule, 1 tablet a day (Patient not taking: Reported on 02/28/2017), Disp: 30 capsule, Rfl: 6 .  Prenatal Vit-Fe Fumarate-FA (PRENATAL MULTIVITAMIN) TABS, Take 1 tablet by mouth daily. (Patient not taking: Reported on 02/28/2017), Disp: 30 tablet, Rfl: 6   Social History: Reviewed -  reports that she has never smoked. She has never used smokeless tobacco.  Objective Findings:  Vitals: unknown if currently breastfeeding.  PHYSICAL EXAMINATION General appearance - alert, well appearing, and in no distress and  overweight Mental status - normal mood, behavior, speech, dress, motor activity, and thought processes, affect appropriate to mood  PELVIC Deferred  Discussion: 1.Discussed with pt risks and benefits of BTL. Discussed using clips only vs bilateral salpingectomy. Advised pt that bilateral salpingectomy is a permanent procedure, but reduces the risk of cancer by 2/3.  At end of discussion, pt had opportunity to ask questions and has no further questions at this time.   Specific discussion of permanent sterilization as noted above. Greater than 50% was spent in counseling and coordination of care with the patient.   Total time greater than: 15 minutes.    Assessment & Plan:   A:  1. Contraception management 2. Bilateral tubal ligation discussion  P:  1. Tubal with Salpingectomy 2. Sign tubal paper today   By signing my name below, I, 14/05/2016, attest that this documentation has been prepared under the direction and in the presence of Arnette Norris, MD. Electronically Signed: [name] Medical Scribe. 05/08/19. 2:48 PM.  I personally performed the services described in this documentation, which was SCRIBED in my presence. The recorded information has been  reviewed and considered accurate. It has been edited as necessary during review. Jonnie Kind, MD

## 2019-06-04 ENCOUNTER — Encounter: Payer: Self-pay | Admitting: Obstetrics and Gynecology

## 2019-06-04 ENCOUNTER — Ambulatory Visit (INDEPENDENT_AMBULATORY_CARE_PROVIDER_SITE_OTHER): Payer: Medicaid Other | Admitting: Obstetrics and Gynecology

## 2019-06-04 ENCOUNTER — Other Ambulatory Visit (HOSPITAL_COMMUNITY)
Admission: RE | Admit: 2019-06-04 | Discharge: 2019-06-04 | Disposition: A | Discharge status: Home / Self Care | Payer: Medicaid Other | Source: Ambulatory Visit | Attending: Obstetrics and Gynecology | Admitting: Obstetrics and Gynecology

## 2019-06-04 ENCOUNTER — Other Ambulatory Visit: Payer: Self-pay

## 2019-06-04 VITALS — BP 139/89 | HR 88 | Ht 65.0 in | Wt 257.8 lb

## 2019-06-04 DIAGNOSIS — Z113 Encounter for screening for infections with a predominantly sexual mode of transmission: Secondary | ICD-10-CM | POA: Insufficient documentation

## 2019-06-04 NOTE — Progress Notes (Signed)
Preoperative History and Physical  Shelly Hancock is a 29 y.o. [id number] here for surgical management of elective permanent sterilization, with removal of Nexplanon.   No significant preoperative concerns. She has not had a menses in several months til just this morning, due to Nexplanon suppression of menses.  She has bleeding this morning lightly  She understands the permanency of requested sterilization, the reason for salpingectomy which is cancer risk reduction as a benefit in addition to sterilization.  She understands and acknowledges the irreversibility of bilateral salpingectomy  Proposed surgery: Laparoscopic bilateral salpingectomy, removal of nexplanon left upper arm  Past Medical History:  Diagnosis Date  . Eczema   . Headache(784.0)    Migraines  . Hypertension    with this pregnancy  . Postpartum depression   . Urinary tract infection   . Vaginal Pap smear, abnormal    Past Surgical History:  Procedure Laterality Date  . NO PAST SURGERIES     OB History  Gravida Para Term Preterm AB Living  2 2 2  0 0 2  SAB TAB Ectopic Multiple Live Births  0 0 0 0 2    # Outcome Date GA Lbr Len/2nd Weight Sex Delivery Anes PTL Lv  2 Term 02/12/17 [redacted]w[redacted]d 04:34 / 01:31 7 lb 7 oz (3.374 kg) M Vag-Spont EPI  LIV  1 Term 11/02/11 [redacted]w[redacted]d 14:15 / 01:00 7 lb 3.9 oz (3.286 kg) M Vag-Spont EPI  LIV  Patient denies any other pertinent gynecologic issues.   Current Outpatient Medications on File Prior to Visit  Medication Sig Dispense Refill  . etonogestrel (NEXPLANON) 68 MG IMPL implant 1 each by Subdermal route once.     No current facility-administered medications on file prior to visit.   No Known Allergies  Social History:   reports that she has never smoked. She has never used smokeless tobacco. She reports current alcohol use. She reports that she does not use drugs.  Family History  Problem Relation Age of Onset  . Diabetes Paternal Grandmother   . Heart disease Paternal  Grandmother   . Cancer Maternal Grandmother        colon  . Heart attack Father   . Hypertension Mother   . Irritable bowel syndrome Son   . Crohn's disease Son   . Cancer Maternal Aunt        skin  . Cancer Maternal Uncle        lung cancer  . Other Neg Hx     Review of Systems: Noncontributory  PHYSICAL EXAM: Blood pressure 139/89, pulse 88, height 5\' 5"  (1.651 m), weight 257 lb 12.8 oz (116.9 kg).  Overweight Body mass index is 42.9 kg/m.  General appearance - alert, well appearing, and in no distress Chest - clear to auscultation, no wheezes, rales or rhonchi, symmetric air entry Heart - normal rate and regular rhythm Abdomen - soft, nontender, nondistended, no masses or organomegaly umbilicus is nontender no prior surgeries                     Lower abdominal pannus with area that would allow easy access to the lower abdomen beneath the pannus Pelvic - examination limited.  External genitalia normal vagina with light menstrual bleeding.  GC and Chlamydia culture obtained  Digital bimanual exam shows nontender cervix well supported adnexal structures nontender bimanual and unable to palpate uterus suggesting normal small size Extremities - peripheral pulses normal, no pedal edema, no clubbing or cyanosis  Labs: No results  found for this or any previous visit (from the past 336 hour(s)).  Imaging Studies: No results found.  Assessment: Patient Active Problem List   Diagnosis Date Noted  . Elevated BP without diagnosis of hypertension 11/18/2016    Priority: Low  . Sterilization consult 05/08/2019  . Nexplanon insertion 03/16/2017  . GBS bacteriuria 10/15/2016  . History of postpartum depression 09/22/2016    Plan: Patient will undergo surgical management with laparoscopic bilateral salpingectomy, removal of Nexplanon implant from left upper inner arm.   [name], MD  06/04/2019 5:10 PM

## 2019-06-07 LAB — CERVICOVAGINAL ANCILLARY ONLY
Chlamydia: NEGATIVE
Comment: NEGATIVE
Comment: NORMAL
Neisseria Gonorrhea: NEGATIVE

## 2019-07-12 ENCOUNTER — Other Ambulatory Visit: Payer: Self-pay | Admitting: Obstetrics and Gynecology

## 2019-07-12 NOTE — Patient Instructions (Signed)
Shelly Hancock  07/12/2019     @PREFPERIOPPHARMACY @   Your procedure is scheduled on  07/17/2019 .  Report to 07/19/2019 at  0730  A.M.  Call this number if you have problems the morning of surgery:  [phone]   Remember:  Do not eat or drink after midnight.                         Take these medicines the morning of surgery with A SIP OF WATER  None    Do not wear jewelry, make-up or nail polish.  Do not wear lotions, powders, or perfumes. Please wear deodorant and brush your teeth.  Do not shave 48 hours prior to surgery.  Men may shave face and neck.  Do not bring valuables to the hospital.  [facility] is not responsible for any belongings or valuables.  Contacts, dentures or bridgework may not be worn into surgery.  Leave your suitcase in the car.  After surgery it may be brought to your room.  For patients admitted to the hospital, discharge time will be determined by your treatment team.  Patients discharged the day of surgery will not be allowed to drive home.   Name and phone number of your driver:   family Special instructions:  DO NOT smoke the morning of your surgery.  Please read over the following fact sheets that you were given. Anesthesia Post-op Instructions and Care and Recovery After Surgery       Salpingectomy, Care After This sheet gives you information about how to care for yourself after your procedure. Your health care provider may also give you more specific instructions. If you have problems or questions, contact your health care provider. What can I expect after the procedure? After the procedure, it is common to have:  Pain in your abdomen.  Some light vaginal bleeding (spotting) for a few days.  Tiredness. Your recovery time will vary depending on which method your surgeon used for your surgery. Follow these instructions at home: Incision care   Follow instructions from your health care provider about how to take  care of your incisions. Make sure you: ? Wash your hands with soap and water before and after you change your bandage (dressing). If soap and water are not available, use hand sanitizer. ? Change or remove your dressing as told by your health care provider. ? Leave any stitches (sutures), skin glue, or adhesive strips in place. These skin closures may need to stay in place for 2 weeks or longer. If adhesive strip edges start to loosen and curl up, you may trim the loose edges. Do not remove adhesive strips completely unless your health care provider tells you to do that.  Keep your dressing clean and dry.  Check your incision area every day for signs of infection. Check for: ? Redness, swelling, or pain that gets worse. ? Fluid or blood. ? Warmth. ? Pus or a bad smell. Activity  Rest as told by your health care provider.  Avoid sitting for a long time without moving. Get up to take short walks every 1-2 hours. This is important to improve blood flow and breathing. Ask for help if you feel weak or unsteady.  Return to your normal activities as told by your health care provider. Ask your health care provider what activities are safe for you.  Do not drive until your health care provider says  that it is safe.  Do not lift anything that is heavier than 10 lb (4.5 kg), or the limit that you are told, until your health care provider says that it is safe. This may be 2-6 weeks depending on your surgery.  Until your health care provider approves: ? Do not douche. ? Do not use tampons. ? Do not have sex. Medicines  Take over-the-counter and prescription medicines only as told by your health care provider.  Ask your health care provider if the medicine prescribed to you: ? Requires you to avoid driving or using heavy machinery. ? Can cause constipation. You may need to take actions to prevent or treat constipation, such as:  Drink enough fluid to keep your urine pale yellow.  Take  over-the-counter or prescription medicines.  Eat foods that are high in fiber, such as beans, whole grains, and fresh fruits and vegetables.  Limit foods that are high in fat and processed sugars, such as fried or sweet foods. General instructions  Wear compression stockings as told by your health care provider. These stockings help to prevent blood clots and reduce swelling in your legs.  Do not use any products that contain nicotine or tobacco, such as cigarettes, e-cigarettes, and chewing tobacco. If you need help quitting, ask your health care provider.  Do not take baths, swim, or use a hot tub until your health care provider approves. You may take showers.  Keep all follow-up visits as told by your health care provider. This is important. Contact a health care provider if you have:  Pain when you urinate.  Redness, swelling, or pain around an incision.  Fluid or blood coming from an incision.  Pus or a bad smell coming from an incision.  An incision that feels warm to the touch.  A fever.  Abdominal pain that gets worse or does not get better with medicine.  An incision that starts to break open.  A rash.  Light-headedness.  Nausea and vomiting. Get help right away if you:  Have pain in your chest or leg.  Develop shortness of breath.  Faint.  Have increased or heavy vaginal bleeding, such as soaking a pad in an hour. Summary  After the procedure, it is common to feel tired, have some pain in your abdomen, and have some light vaginal bleeding for a few days.  Follow instructions from your health care provider about how to take care of your incisions.  Return to your normal activities as told by your health care provider. Ask your health care provider what activities are safe for you.  Do not douche, use tampons, or have sex until your health care provider approves.  Keep all follow-up visits as told by your health care provider. This information is not  intended to replace advice given to you by your health care provider. Make sure you discuss any questions you have with your health care provider. Document Revised: 03/06/2018 Document Reviewed: 03/06/2018 Elsevier Patient Education  2020 [employer]  General Anesthesia, Adult, Care After This sheet gives you information about how to care for yourself after your procedure. Your health care provider may also give you more specific instructions. If you have problems or questions, contact your health care provider. What can I expect after the procedure? After the procedure, the following side effects are common:  Pain or discomfort at the IV site.  Nausea.  Vomiting.  Sore throat.  Trouble concentrating.  Feeling cold or chills.  Weak or tired.  Sleepiness and fatigue.  Soreness and body aches. These side effects can affect parts of the body that were not involved in surgery. Follow these instructions at home:  For at least 24 hours after the procedure:  Have a responsible adult stay with you. It is important to have someone help care for you until you are awake and alert.  Rest as needed.  Do not: ? Participate in activities in which you could fall or become injured. ? Drive. ? Use heavy machinery. ? Drink alcohol. ? Take sleeping pills or medicines that cause drowsiness. ? Make important decisions or sign legal documents. ? Take care of children on your own. Eating and drinking  Follow any instructions from your health care provider about eating or drinking restrictions.  When you feel hungry, start by eating small amounts of foods that are soft and easy to digest (bland), such as toast. Gradually return to your regular diet.  Drink enough fluid to keep your urine pale yellow.  If you vomit, rehydrate by drinking water, juice, or clear broth. General instructions  If you have sleep apnea, surgery and certain medicines can increase your risk for breathing  problems. Follow instructions from your health care provider about wearing your sleep device: ? Anytime you are sleeping, including during daytime naps. ? While taking prescription pain medicines, sleeping medicines, or medicines that make you drowsy.  Return to your normal activities as told by your health care provider. Ask your health care provider what activities are safe for you.  Take over-the-counter and prescription medicines only as told by your health care provider.  If you smoke, do not smoke without supervision.  Keep all follow-up visits as told by your health care provider. This is important. Contact a health care provider if:  You have nausea or vomiting that does not get better with medicine.  You cannot eat or drink without vomiting.  You have pain that does not get better with medicine.  You are unable to pass urine.  You develop a skin rash.  You have a fever.  You have redness around your IV site that gets worse. Get help right away if:  You have difficulty breathing.  You have chest pain.  You have blood in your urine or stool, or you vomit blood. Summary  After the procedure, it is common to have a sore throat or nausea. It is also common to feel tired.  Have a responsible adult stay with you for the first 24 hours after general anesthesia. It is important to have someone help care for you until you are awake and alert.  When you feel hungry, start by eating small amounts of foods that are soft and easy to digest (bland), such as toast. Gradually return to your regular diet.  Drink enough fluid to keep your urine pale yellow.  Return to your normal activities as told by your health care provider. Ask your health care provider what activities are safe for you. This information is not intended to replace advice given to you by your health care provider. Make sure you discuss any questions you have with your health care provider. Document Revised:  03/18/2017 Document Reviewed: 10/29/2016 Elsevier Patient Education  2020 [employer]. How to Use Chlorhexidine for Bathing Chlorhexidine gluconate (CHG) is a germ-killing (antiseptic) solution that is used to clean the skin. It can get rid of the bacteria that normally live on the skin and can keep them away for about 24 hours. To clean your  skin with CHG, you may be given:  A CHG solution to use in the shower or as part of a sponge bath.  A prepackaged cloth that contains CHG. Cleaning your skin with CHG may help lower the risk for infection:  While you are staying in the intensive care unit of the hospital.  If you have a vascular access, such as a central line, to provide short-term or long-term access to your veins.  If you have a catheter to drain urine from your bladder.  If you are on a ventilator. A ventilator is a machine that helps you breathe by moving air in and out of your lungs.  After surgery. What are the risks? Risks of using CHG include:  A skin reaction.  Hearing loss, if CHG gets in your ears.  Eye injury, if CHG gets in your eyes and is not rinsed out.  The CHG product catching fire. Make sure that you avoid smoking and flames after applying CHG to your skin. Do not use CHG:  If you have a chlorhexidine allergy or have previously reacted to chlorhexidine.  On babies younger than 43 months of age. How to use CHG solution  Use CHG only as told by your health care provider, and follow the instructions on the label.  Use the full amount of CHG as directed. Usually, this is one bottle. During a shower Follow these steps when using CHG solution during a shower (unless your health care provider gives you different instructions): 1. Start the shower. 2. Use your normal soap and shampoo to wash your face and hair. 3. Turn off the shower or move out of the shower stream. 4. Pour the CHG onto a clean washcloth. Do not use any type of brush or rough-edged  sponge. 5. Starting at your neck, lather your body down to your toes. Make sure you follow these instructions: ? If you will be having surgery, pay special attention to the part of your body where you will be having surgery. Scrub this area for at least 1 minute. ? Do not use CHG on your head or face. If the solution gets into your ears or eyes, rinse them well with water. ? Avoid your genital area. ? Avoid any areas of skin that have broken skin, cuts, or scrapes. ? Scrub your back and under your arms. Make sure to wash skin folds. 6. Let the lather sit on your skin for 1-2 minutes or as long as told by your health care provider. 7. Thoroughly rinse your entire body in the shower. Make sure that all body creases and crevices are rinsed well. 8. Dry off with a clean towel. Do not put any substances on your body afterward--such as powder, lotion, or perfume--unless you are told to do so by your health care provider. Only use lotions that are recommended by the manufacturer. 9. Put on clean clothes or pajamas. 10. If it is the night before your surgery, sleep in clean sheets.  During a sponge bath Follow these steps when using CHG solution during a sponge bath (unless your health care provider gives you different instructions): 1. Use your normal soap and shampoo to wash your face and hair. 2. Pour the CHG onto a clean washcloth. 3. Starting at your neck, lather your body down to your toes. Make sure you follow these instructions: ? If you will be having surgery, pay special attention to the part of your body where you will be having surgery. Scrub this area  for at least 1 minute. ? Do not use CHG on your head or face. If the solution gets into your ears or eyes, rinse them well with water. ? Avoid your genital area. ? Avoid any areas of skin that have broken skin, cuts, or scrapes. ? Scrub your back and under your arms. Make sure to wash skin folds. 4. Let the lather sit on your skin for 1-2  minutes or as long as told by your health care provider. 5. Using a different clean, wet washcloth, thoroughly rinse your entire body. Make sure that all body creases and crevices are rinsed well. 6. Dry off with a clean towel. Do not put any substances on your body afterward--such as powder, lotion, or perfume--unless you are told to do so by your health care provider. Only use lotions that are recommended by the manufacturer. 7. Put on clean clothes or pajamas. 8. If it is the night before your surgery, sleep in clean sheets. How to use CHG prepackaged cloths  Only use CHG cloths as told by your health care provider, and follow the instructions on the label.  Use the CHG cloth on clean, dry skin.  Do not use the CHG cloth on your head or face unless your health care provider tells you to.  When washing with the CHG cloth: ? Avoid your genital area. ? Avoid any areas of skin that have broken skin, cuts, or scrapes. Before surgery Follow these steps when using a CHG cloth to clean before surgery (unless your health care provider gives you different instructions): 1. Using the CHG cloth, vigorously scrub the part of your body where you will be having surgery. Scrub using a back-and-forth motion for 3 minutes. The area on your body should be completely wet with CHG when you are done scrubbing. 2. Do not rinse. Discard the cloth and let the area air-dry. Do not put any substances on the area afterward, such as powder, lotion, or perfume. 3. Put on clean clothes or pajamas. 4. If it is the night before your surgery, sleep in clean sheets.  For general bathing Follow these steps when using CHG cloths for general bathing (unless your health care provider gives you different instructions). 1. Use a separate CHG cloth for each area of your body. Make sure you wash between any folds of skin and between your fingers and toes. Wash your body in the following order, switching to a new cloth after each  step: ? The front of your neck, shoulders, and chest. ? Both of your arms, under your arms, and your hands. ? Your stomach and groin area, avoiding the genitals. ? Your right leg and foot. ? Your left leg and foot. ? The back of your neck, your back, and your buttocks. 2. Do not rinse. Discard the cloth and let the area air-dry. Do not put any substances on your body afterward--such as powder, lotion, or perfume--unless you are told to do so by your health care provider. Only use lotions that are recommended by the manufacturer. 3. Put on clean clothes or pajamas. Contact a health care provider if:  Your skin gets irritated after scrubbing.  You have questions about using your solution or cloth. Get help right away if:  Your eyes become very red or swollen.  Your eyes itch badly.  Your skin itches badly and is red or swollen.  Your hearing changes.  You have trouble seeing.  You have swelling or tingling in your mouth or throat.  You have trouble breathing.  You swallow any chlorhexidine. Summary  Chlorhexidine gluconate (CHG) is a germ-killing (antiseptic) solution that is used to clean the skin. Cleaning your skin with CHG may help to lower your risk for infection.  You may be given CHG to use for bathing. It may be in a bottle or in a prepackaged cloth to use on your skin. Carefully follow your health care provider's instructions and the instructions on the product label.  Do not use CHG if you have a chlorhexidine allergy.  Contact your health care provider if your skin gets irritated after scrubbing. This information is not intended to replace advice given to you by your health care provider. Make sure you discuss any questions you have with your health care provider. Document Revised: 06/01/2018 Document Reviewed: 02/10/2017 Elsevier Patient Education  [place].

## 2019-07-13 ENCOUNTER — Encounter (HOSPITAL_COMMUNITY)
Admission: RE | Admit: 2019-07-13 | Discharge: 2019-07-13 | Disposition: A | Discharge status: Home / Self Care | Payer: Medicaid Other | Source: Ambulatory Visit | Attending: Obstetrics and Gynecology | Admitting: Obstetrics and Gynecology

## 2019-07-13 ENCOUNTER — Other Ambulatory Visit (HOSPITAL_COMMUNITY)
Admission: RE | Admit: 2019-07-13 | Discharge: 2019-07-13 | Disposition: A | Discharge status: Home / Self Care | Payer: Medicaid Other | Source: Ambulatory Visit | Attending: Obstetrics and Gynecology | Admitting: Obstetrics and Gynecology

## 2019-07-13 ENCOUNTER — Other Ambulatory Visit: Payer: Self-pay

## 2019-07-13 ENCOUNTER — Encounter (HOSPITAL_COMMUNITY): Payer: Self-pay

## 2019-07-13 DIAGNOSIS — Z01812 Encounter for preprocedural laboratory examination: Secondary | ICD-10-CM | POA: Insufficient documentation

## 2019-07-13 DIAGNOSIS — Z20822 Contact with and (suspected) exposure to covid-19: Secondary | ICD-10-CM | POA: Insufficient documentation

## 2019-07-13 LAB — CBC
HCT: 38.8 % (ref 36.0–46.0)
Hemoglobin: 13 g/dL (ref 12.0–15.0)
MCH: 30.4 pg (ref 26.0–34.0)
MCHC: 33.5 g/dL (ref 30.0–36.0)
MCV: 90.9 fL (ref 80.0–100.0)
Platelets: 269 10*3/uL (ref 150–400)
RBC: 4.27 MIL/uL (ref 3.87–5.11)
RDW: 12.2 % (ref 11.5–15.5)
WBC: 6.7 10*3/uL (ref 4.0–10.5)
nRBC: 0 % (ref 0.0–0.2)

## 2019-07-13 LAB — COMPREHENSIVE METABOLIC PANEL
ALT: 21 U/L (ref 0–44)
AST: 20 U/L (ref 15–41)
Albumin: 4 g/dL (ref 3.5–5.0)
Alkaline Phosphatase: 97 U/L (ref 38–126)
Anion gap: 10 (ref 5–15)
BUN: 13 mg/dL (ref 6–20)
CO2: 24 mmol/L (ref 22–32)
Calcium: 9 mg/dL (ref 8.9–10.3)
Chloride: 103 mmol/L (ref 98–111)
Creatinine, Ser: 0.59 mg/dL (ref 0.44–1.00)
GFR calc Af Amer: >60 mL/min (ref >60)
GFR calc non Af Amer: >60 mL/min (ref >60)
Glucose, Bld: 110 mg/dL — ABNORMAL HIGH (ref 70–99)
Potassium: 3.8 mmol/L (ref 3.5–5.1)
Sodium: 137 mmol/L (ref 135–145)
Total Bilirubin: 0.7 mg/dL (ref 0.3–1.2)
Total Protein: 7.3 g/dL (ref 6.5–8.1)

## 2019-07-13 LAB — URINALYSIS, ROUTINE W REFLEX MICROSCOPIC
Bacteria, UA: NONE SEEN
Bilirubin Urine: NEGATIVE
Glucose, UA: NEGATIVE mg/dL
Ketones, ur: NEGATIVE mg/dL
Leukocytes,Ua: NEGATIVE
Nitrite: NEGATIVE
Protein, ur: NEGATIVE mg/dL
Specific Gravity, Urine: 1.016 (ref 1.005–1.030)
pH: 7 (ref 5.0–8.0)

## 2019-07-13 LAB — HCG, SERUM, QUALITATIVE: Preg, Serum: NEGATIVE

## 2019-07-14 LAB — SARS CORONAVIRUS 2 (TAT 6-24 HRS): SARS Coronavirus 2: NEGATIVE

## 2019-07-17 ENCOUNTER — Encounter (HOSPITAL_COMMUNITY)
Admission: RE | Disposition: A | Discharge status: Home / Self Care | Payer: Self-pay | Source: Home / Self Care | Attending: Obstetrics and Gynecology

## 2019-07-17 ENCOUNTER — Ambulatory Visit (HOSPITAL_COMMUNITY): Payer: Medicaid Other | Admitting: Anesthesiology

## 2019-07-17 ENCOUNTER — Encounter (HOSPITAL_COMMUNITY): Payer: Self-pay | Admitting: Obstetrics and Gynecology

## 2019-07-17 ENCOUNTER — Ambulatory Visit (HOSPITAL_COMMUNITY)
Admission: RE | Admit: 2019-07-17 | Discharge: 2019-07-17 | Disposition: A | Discharge status: Home / Self Care | Payer: Medicaid Other | Attending: Obstetrics and Gynecology | Admitting: Obstetrics and Gynecology

## 2019-07-17 DIAGNOSIS — G43909 Migraine, unspecified, not intractable, without status migrainosus: Secondary | ICD-10-CM | POA: Diagnosis not present

## 2019-07-17 DIAGNOSIS — Z8379 Family history of other diseases of the digestive system: Secondary | ICD-10-CM | POA: Diagnosis not present

## 2019-07-17 DIAGNOSIS — Z833 Family history of diabetes mellitus: Secondary | ICD-10-CM | POA: Diagnosis not present

## 2019-07-17 DIAGNOSIS — I10 Essential (primary) hypertension: Secondary | ICD-10-CM | POA: Diagnosis not present

## 2019-07-17 DIAGNOSIS — Z808 Family history of malignant neoplasm of other organs or systems: Secondary | ICD-10-CM | POA: Diagnosis not present

## 2019-07-17 DIAGNOSIS — Z8249 Family history of ischemic heart disease and other diseases of the circulatory system: Secondary | ICD-10-CM | POA: Diagnosis not present

## 2019-07-17 DIAGNOSIS — Z302 Encounter for sterilization: Secondary | ICD-10-CM

## 2019-07-17 DIAGNOSIS — Z801 Family history of malignant neoplasm of trachea, bronchus and lung: Secondary | ICD-10-CM | POA: Diagnosis not present

## 2019-07-17 DIAGNOSIS — Z800 Family history of malignant neoplasm of digestive organs: Secondary | ICD-10-CM | POA: Insufficient documentation

## 2019-07-17 DIAGNOSIS — L309 Dermatitis, unspecified: Secondary | ICD-10-CM | POA: Insufficient documentation

## 2019-07-17 DIAGNOSIS — Z3046 Encounter for surveillance of implantable subdermal contraceptive: Secondary | ICD-10-CM | POA: Diagnosis not present

## 2019-07-17 HISTORY — PX: REMOVAL OF NON VAGINAL CONTRACEPTIVE DEVICE: SHX6219

## 2019-07-17 HISTORY — PX: LAPAROSCOPIC BILATERAL SALPINGECTOMY: SHX5889

## 2019-07-17 SURGERY — SALPINGECTOMY, BILATERAL, LAPAROSCOPIC
Anesthesia: General | Laterality: Left

## 2019-07-17 MED ORDER — LIDOCAINE 2% (20 MG/ML) 5 ML SYRINGE
INTRAMUSCULAR | Status: AC
  Filled 2019-07-17: qty 5

## 2019-07-17 MED ORDER — MIDAZOLAM HCL 2 MG/2ML IJ SOLN
INTRAMUSCULAR | Status: DC | PRN
  Administered 2019-07-17: 2 mg via INTRAVENOUS

## 2019-07-17 MED ORDER — FENTANYL CITRATE (PF) 100 MCG/2ML IJ SOLN
INTRAMUSCULAR | Status: AC
  Filled 2019-07-17: qty 2

## 2019-07-17 MED ORDER — SUCCINYLCHOLINE CHLORIDE 20 MG/ML IJ SOLN
INTRAMUSCULAR | Status: DC | PRN
  Administered 2019-07-17: 120 mg via INTRAVENOUS

## 2019-07-17 MED ORDER — KETOROLAC TROMETHAMINE 30 MG/ML IJ SOLN
30.0000 mg | Freq: Once | INTRAMUSCULAR | Status: AC
  Administered 2019-07-17: 30 mg via INTRAVENOUS

## 2019-07-17 MED ORDER — TRAMADOL HCL 50 MG PO TABS: 50.0000 mg | ORAL_TABLET | Freq: Four times a day (QID) | ORAL | 0 refills | Status: DC | PRN

## 2019-07-17 MED ORDER — ONDANSETRON HCL 4 MG/2ML IJ SOLN
INTRAMUSCULAR | Status: AC
  Filled 2019-07-17: qty 2

## 2019-07-17 MED ORDER — SUCCINYLCHOLINE CHLORIDE 200 MG/10ML IV SOSY
PREFILLED_SYRINGE | INTRAVENOUS | Status: AC
  Filled 2019-07-17: qty 10

## 2019-07-17 MED ORDER — 0.9 % SODIUM CHLORIDE (POUR BTL) OPTIME
TOPICAL | Status: DC | PRN
  Administered 2019-07-17: 1000 mL

## 2019-07-17 MED ORDER — MIDAZOLAM HCL 2 MG/2ML IJ SOLN
INTRAMUSCULAR | Status: AC
  Filled 2019-07-17: qty 2

## 2019-07-17 MED ORDER — ROCURONIUM BROMIDE 10 MG/ML (PF) SYRINGE
PREFILLED_SYRINGE | INTRAVENOUS | Status: AC
  Filled 2019-07-17: qty 10

## 2019-07-17 MED ORDER — PROPOFOL 10 MG/ML IV BOLUS
INTRAVENOUS | Status: DC | PRN
  Administered 2019-07-17: 200 mg via INTRAVENOUS

## 2019-07-17 MED ORDER — FENTANYL CITRATE (PF) 100 MCG/2ML IJ SOLN
25.0000 ug | INTRAMUSCULAR | Status: DC | PRN
  Administered 2019-07-17 (×2): 50 ug via INTRAVENOUS

## 2019-07-17 MED ORDER — BUPIVACAINE-EPINEPHRINE (PF) 0.5% -1:200000 IJ SOLN
INTRAMUSCULAR | Status: AC
  Filled 2019-07-17: qty 30

## 2019-07-17 MED ORDER — SUGAMMADEX SODIUM 500 MG/5ML IV SOLN
INTRAVENOUS | Status: DC | PRN
  Administered 2019-07-17: 200 mg via INTRAVENOUS

## 2019-07-17 MED ORDER — LACTATED RINGERS IV SOLN: INTRAVENOUS | Status: DC

## 2019-07-17 MED ORDER — FENTANYL CITRATE (PF) 250 MCG/5ML IJ SOLN
INTRAMUSCULAR | Status: AC
  Filled 2019-07-17: qty 5

## 2019-07-17 MED ORDER — LIDOCAINE HCL (CARDIAC) PF 50 MG/5ML IV SOSY
PREFILLED_SYRINGE | INTRAVENOUS | Status: DC | PRN
  Administered 2019-07-17: 100 mg via INTRAVENOUS

## 2019-07-17 MED ORDER — KETOROLAC TROMETHAMINE 30 MG/ML IJ SOLN
INTRAMUSCULAR | Status: AC
  Filled 2019-07-17: qty 1

## 2019-07-17 MED ORDER — METOCLOPRAMIDE HCL 5 MG/ML IJ SOLN
INTRAMUSCULAR | Status: AC
  Filled 2019-07-17: qty 2

## 2019-07-17 MED ORDER — FENTANYL CITRATE (PF) 100 MCG/2ML IJ SOLN
INTRAMUSCULAR | Status: DC | PRN
  Administered 2019-07-17 (×2): 100 ug via INTRAVENOUS
  Administered 2019-07-17: 50 ug via INTRAVENOUS

## 2019-07-17 MED ORDER — ROCURONIUM BROMIDE 100 MG/10ML IV SOLN
INTRAVENOUS | Status: DC | PRN
  Administered 2019-07-17: 30 mg via INTRAVENOUS

## 2019-07-17 MED ORDER — ONDANSETRON HCL 4 MG/2ML IJ SOLN
INTRAMUSCULAR | Status: DC | PRN
  Administered 2019-07-17: 4 mg via INTRAVENOUS

## 2019-07-17 MED ORDER — PROPOFOL 10 MG/ML IV BOLUS
INTRAVENOUS | Status: AC
  Filled 2019-07-17: qty 40

## 2019-07-17 SURGICAL SUPPLY — 43 items
BANDAGE STRIP 1X3 FLEXIBLE (GAUZE/BANDAGES/DRESSINGS) ×4 IMPLANT
BLADE SURG SZ11 CARB STEEL (BLADE) ×3 IMPLANT
BNDG ADH 1X3 FABRIC TAN LF (GAUZE/BANDAGES/DRESSINGS) ×9 IMPLANT
BNDG ADH 3X1 STRCH TAN LF (GAUZE/BANDAGES/DRESSINGS) ×6
CATH ROBINSON RED A/P 16FR (CATHETERS) ×1 IMPLANT
CLOSURE STERI-STRIP 1/4X4 (GAUZE/BANDAGES/DRESSINGS) ×3 IMPLANT
CLOTH BEACON ORANGE TIMEOUT ST (SAFETY) ×3 IMPLANT
COVER LIGHT HANDLE STERIS (MISCELLANEOUS) ×6 IMPLANT
COVER WAND RF STERILE (DRAPES) ×3 IMPLANT
DECANTER SPIKE VIAL GLASS SM (MISCELLANEOUS) ×3 IMPLANT
DURAPREP 26ML APPLICATOR (WOUND CARE) ×3 IMPLANT
ELECT REM PT RETURN 9FT ADLT (ELECTROSURGICAL) ×3
ELECTRODE REM PT RTRN 9FT ADLT (ELECTROSURGICAL) ×2 IMPLANT
GAUZE 4X4 16PLY RFD (DISPOSABLE) ×3 IMPLANT
GLOVE BIOGEL PI IND STRL 7.0 (GLOVE) ×6 IMPLANT
GLOVE BIOGEL PI IND STRL 9 (GLOVE) ×2 IMPLANT
GLOVE BIOGEL PI INDICATOR 7.0 (GLOVE) ×3
GLOVE BIOGEL PI INDICATOR 9 (GLOVE) ×1
GLOVE ECLIPSE 9.0 STRL (GLOVE) ×6 IMPLANT
GOWN SPEC L3 XXLG W/TWL (GOWN DISPOSABLE) ×3 IMPLANT
GOWN STRL REUS W/TWL LRG LVL3 (GOWN DISPOSABLE) ×3 IMPLANT
INST SET LAPROSCOPIC GYN AP (KITS) ×3 IMPLANT
KIT TURNOVER KIT A (KITS) ×3 IMPLANT
NDL HYPO 25X1 1.5 SAFETY (NEEDLE) ×2 IMPLANT
NEEDLE HYPO 25X1 1.5 SAFETY (NEEDLE) ×3 IMPLANT
NEEDLE INSUFFLATION 120MM (ENDOMECHANICALS) ×3 IMPLANT
NS IRRIG 1000ML POUR BTL (IV SOLUTION) ×3 IMPLANT
PACK PERI GYN (CUSTOM PROCEDURE TRAY) ×3 IMPLANT
PAD ARMBOARD 7.5X6 YLW CONV (MISCELLANEOUS) ×3 IMPLANT
SET BASIN LINEN APH (SET/KITS/TRAYS/PACK) ×3 IMPLANT
SET TUBE SMOKE EVAC HIGH FLOW (TUBING) ×3 IMPLANT
SHEARS HARMONIC ACE PLUS 36CM (ENDOMECHANICALS) ×3 IMPLANT
SLEEVE ENDOPATH XCEL 5M (ENDOMECHANICALS) ×3 IMPLANT
SOL ANTI FOG 6CC (MISCELLANEOUS) ×2 IMPLANT
SOLUTION ANTI FOG 6CC (MISCELLANEOUS) ×1
STRIP CLOSURE SKIN 1/4X3 (GAUZE/BANDAGES/DRESSINGS) ×3 IMPLANT
SUT VIC AB 4-0 PS2 27 (SUTURE) ×3 IMPLANT
SYR 10ML LL (SYRINGE) ×3 IMPLANT
SYR BULB IRRIGATION 50ML (SYRINGE) ×3 IMPLANT
SYR CONTROL 10ML LL (SYRINGE) ×3 IMPLANT
TROCAR XCEL NON BLADE 8MM B8LT (ENDOMECHANICALS) ×3 IMPLANT
TROCAR XCEL NON-BLD 5MMX100MML (ENDOMECHANICALS) ×3 IMPLANT
WARMER LAPAROSCOPE (MISCELLANEOUS) ×3 IMPLANT

## 2019-07-17 NOTE — Transfer of Care (Signed)
98.1Immediate Anesthesia Transfer of Care Note  Patient: Shelly Hancock  Procedure(s) Performed: LAPAROSCOPIC BILATERAL SALPINGECTOMY (Bilateral ) REMOVAL OF NON VAGINAL CONTRACEPTIVE DEVICE, NEXPLANON (Left )  Patient Location: PACU  Anesthesia Type:General  Level of Consciousness: awake and patient cooperative  Airway & Oxygen Therapy: Patient Spontanous Breathing and non-rebreather face mask  Post-op Assessment: Report given to RN and Post -op Vital signs reviewed and stable  Post vital signs: Reviewed and stable  Last Vitals:  Vitals Value Taken Time  BP    Temp 98.1   Pulse 103 07/17/19 0935  Resp 17 07/17/19 0935  SpO2 100 % 07/17/19 0935  Vitals shown include unvalidated device data.  Last Pain:  Vitals:   07/17/19 0742  TempSrc: Oral  PainSc: 0-No pain      Patients Stated Pain Goal: 5 (07/17/19 2984)  Complications: No apparent anesthesia complications

## 2019-07-17 NOTE — Op Note (Signed)
07/17/2019  9:34 AM  PATIENT:  Shelly Hancock  29 y.o. female  PRE-OPERATIVE DIAGNOSIS:  Sterilization, elective permanent.  #2 removal of Nexplanon left arm  POST-OPERATIVE DIAGNOSIS:  Sterilization, elective permanent.  #2 removal Nexplanon left arm  PROCEDURE:  Procedure(s): LAPAROSCOPIC BILATERAL SALPINGECTOMY (Bilateral) REMOVAL OF NON VAGINAL CONTRACEPTIVE DEVICE, NEXPLANON (Left)  SURGEON:  Surgeon(s) and Role:    * Laquanna Veazey V, MD - Primary  PHYSICIAN ASSISTANT:   ASSISTANTS: Angela Witt, CST  ANESTHESIA:   general  EBL:  10 mL   BLOOD ADMINISTERED:none  DRAINS: none   LOCAL MEDICATIONS USED:    and NONE  SPECIMEN:  Source of Specimen:  Bilateral fallopian tubes.  Nexplanon discarded  DISPOSITION OF SPECIMEN:  PATHOLOGY  COUNTS:  YES  TOURNIQUET:  * No tourniquets in log *  DICTATION: .Dragon Dictation  PLAN OF CARE: Discharge to home after PACU  PATIENT DISPOSITION:  PACU - hemodynamically stable.   Delay start of Pharmacological VTE agent (>24hrs) due to surgical blood loss or risk of bleeding: not applicable Details of procedure: Patient was taken the operating prepped and draped for combined abdominal and vaginal procedure with legs in yellowfin low leg support.  Timeout was conducted and procedure confirmed by operative team.  Speculum was inserted, single-tooth tenaculum used to grasp the cervix, and out catheterization of bladder performed obtaining 200 cc of clear urine.  Attention was then directed to the umbilicus where a midline vertical infraumbilical 1 cm incision was made as well as a suprapubic transverse incision of similar length and a right lower quadrant incision.  The hemostat was used to retract the umbilical stalk and the Veress needle was used to enter insufflate the abdomen while holding the abdomen upward and orienting the Veress needle toward the pelvis.  Loss-of-resistance technique was used with water droplet confirmation of  intraperitoneal location.  Insufflation pressure began at 6 mmHg pressure.  After 3 L CO2 inspection with the 3 mm camera showed no evidence of any abnormality associated with peritoneal entry.  Photos were taken to document.  Suprapubic and right lower quadrant trochars were inserted under direct laparoscopic visualization.,  With the patient in Trendelenburg position the uterus which was anteverted could be identified.  Ovaries were normal and tubes were normal.  Photodocumentation was performed.  The left fallopian tube was removed using harmonic Ace 7 coagulation device to transect and remove the tube whic as extracted through the suprapubic port.  the right fallopian tube was treated similarly. 120 cc of saline was instilled in the abdomen followed by deflation of the abdomen removal of laparoscopic trochars, and subcuticular 4-0 Vicryl closure of the skin incision and Steri-Strip placement.  Vaginal trochars were removed.  Nexplanon removal.  The left arm was then prepped, with Betadine, draped with green towels, and the distal tip of the intact Nexplanon was identified and pushed up against the skin with a 3 mm skin opening made directly over the tip of the Nexplanon which was easily expressed and extracted Steri-Strip was placed followed by Band-Aid and patient went to the recovery room in good condition. She did have an unexpected amount of initial discomfort related to the trocar sites and will be given analgesics in recovery room.  Again the procedure itself was considered technically very straightforward.   

## 2019-07-17 NOTE — Anesthesia Postprocedure Evaluation (Signed)
Anesthesia Post Note  Patient: Shelly Hancock  Procedure(s) Performed: LAPAROSCOPIC BILATERAL SALPINGECTOMY (Bilateral ) REMOVAL OF NON VAGINAL CONTRACEPTIVE DEVICE, NEXPLANON (Left )  Patient location during evaluation: PACU Anesthesia Type: General Level of consciousness: awake, patient cooperative and awake and alert Pain management: satisfactory to patient Vital Signs Assessment: post-procedure vital signs reviewed and stable Respiratory status: spontaneous breathing Postop Assessment: no apparent nausea or vomiting Anesthetic complications: no     Last Vitals:  Vitals:   07/17/19 1000 07/17/19 1012  BP:  131/79  Pulse: 91 87  Resp: 18 19  Temp:    SpO2: 98% 99%    Last Pain:  Vitals:   07/17/19 1012  TempSrc:   PainSc: Asleep                 Boniface Goffe

## 2019-07-17 NOTE — Anesthesia Preprocedure Evaluation (Signed)
Anesthesia Evaluation  Patient identified by MRN, date of birth, ID band Patient awake    Reviewed: Allergy & Precautions, H&P , NPO status , Patient's Chart, lab work & pertinent test results, reviewed documented beta blocker date and time   Airway Mallampati: II  TM Distance: >3 FB Neck ROM: full    Dental no notable dental hx.    Pulmonary neg pulmonary ROS,    Pulmonary exam normal breath sounds clear to auscultation       Cardiovascular Exercise Tolerance: Good hypertension, negative cardio ROS   Rhythm:regular Rate:Normal     Neuro/Psych negative neurological ROS  negative psych ROS   GI/Hepatic negative GI ROS, Neg liver ROS,   Endo/Other  negative endocrine ROS  Renal/GU negative Renal ROS  negative genitourinary   Musculoskeletal   Abdominal   Peds  Hematology negative hematology ROS (+)   Anesthesia Other Findings   Reproductive/Obstetrics negative OB ROS                             Anesthesia Physical Anesthesia Plan  ASA: I  Anesthesia Plan: General   Post-op Pain Management:    Induction:   PONV Risk Score and Plan: 2 and Ondansetron  Airway Management Planned:   Additional Equipment:   Intra-op Plan:   Post-operative Plan:   Informed Consent: I have reviewed the patients History and Physical, chart, labs and discussed the procedure including the risks, benefits and alternatives for the proposed anesthesia with the patient or authorized representative who has indicated his/her understanding and acceptance.     Dental Advisory Given  Plan Discussed with: CRNA  Anesthesia Plan Comments:         Anesthesia Quick Evaluation

## 2019-07-17 NOTE — Interval H&P Note (Signed)
History and Physical Interval Note:  07/17/2019 8:18 AM  Shelly Hancock  has presented today for surgery, with the diagnosis of Sterilization.  The various methods of treatment have been discussed with the patient and family. After consideration of risks, benefits and other options for treatment, the patient has consented to  Procedure(s): LAPAROSCOPIC BILATERAL SALPINGECTOMY (Bilateral) REMOVAL OF NON VAGINAL CONTRACEPTIVE DEVICE, NEXPLANON (Left) as a surgical intervention.  The patient's history has been reviewed, patient examined, no change in status, stable for surgery.  I have reviewed the patient's chart and labs.  Questions were answered to the patient's satisfaction.   Risks, potential complications such as bowel injury related to entry reviewed. Location of Nexplanon in LEFT upper arm confirmed and left hand marked with JF   [name]

## 2019-07-17 NOTE — Anesthesia Procedure Notes (Signed)
Procedure Name: Intubation Date/Time: 07/17/2019 8:51 AM Performed by: Vista Deck, CRNA Pre-anesthesia Checklist: Patient identified, Patient being monitored, Timeout performed, Emergency Drugs available and Suction available Patient Re-evaluated:Patient Re-evaluated prior to induction Oxygen Delivery Method: Circle System Utilized Preoxygenation: Pre-oxygenation with 100% oxygen Induction Type: IV induction Laryngoscope Size: Mac and 3 Grade View: Grade I Tube type: Oral Tube size: 7.0 mm Number of attempts: 1 Airway Equipment and Method: stylet and Oral airway Placement Confirmation: ETT inserted through vocal cords under direct vision,  positive ETCO2 and breath sounds checked- equal and bilateral Secured at: 21 cm Tube secured with: Tape Dental Injury: Teeth and Oropharynx as per pre-operative assessment

## 2019-07-17 NOTE — Discharge Instructions (Signed)
Laparoscopic Tubal Ligation, Care After This sheet gives you information about how to care for yourself after your procedure. Your health care provider may also give you more specific instructions. If you have problems or questions, contact your health care provider. What can I expect after the procedure? After the procedure, it is common to have:  A sore throat.  Discomfort in your shoulder.  Mild discomfort or cramping in your abdomen.  Gas pains.  Pain or soreness in the area where the surgical incision was made.  A bloated feeling.  Tiredness.  Nausea.  Vomiting. Follow these instructions at home: Medicines  Take over-the-counter and prescription medicines only as told by your health care provider.  Do not take aspirin because it can cause bleeding.  Ask your health care provider if the medicine prescribed to you: ? Requires you to avoid driving or using heavy machinery. ? Can cause constipation. You may need to take actions to prevent or treat constipation, such as:  Drink enough fluid to keep your urine pale yellow.  Take over-the-counter or prescription medicines.  Eat foods that are high in fiber, such as beans, whole grains, and fresh fruits and vegetables.  Limit foods that are high in fat and processed sugars, such as fried or sweet foods. Incision care      Follow instructions from your health care provider about how to take care of your incision. Make sure you: ? Wash your hands with soap and water before and after you change your bandage (dressing). If soap and water are not available, use hand sanitizer. ? Change your dressing as told by your health care provider. ? Leave stitches (sutures), skin glue, or adhesive strips in place. These skin closures may need to stay in place for 2 weeks or longer. If adhesive strip edges start to loosen and curl up, you may trim the loose edges. Do not remove adhesive strips completely unless your health care provider  tells you to do that.  Check your incision area every day for signs of infection. Check for: ? Redness, swelling, or pain. ? Fluid or blood. ? Warmth. ? Pus or a bad smell. Activity  Rest as told by your health care provider.  Avoid sitting for a long time without moving. Get up to take short walks every 1-2 hours. This is important to improve blood flow and breathing. Ask for help if you feel weak or unsteady.  Return to your normal activities as told by your health care provider. Ask your health care provider what activities are safe for you. General instructions  Do not take baths, swim, or use a hot tub until your health care provider approves. Ask your health care provider if you may take showers. You may only be allowed to take sponge baths.  Have someone help you with your daily household tasks for the first few days.  Keep all follow-up visits as told by your health care provider. This is important. Contact a health care provider if:  You have redness, swelling, or pain around your incision.  Your incision feels warm to the touch.  You have pus or a bad smell coming from your incision.  The edges of your incision break open after the sutures have been removed.  Your pain does not improve after 2-3 days.  You have a rash.  You repeatedly become dizzy or light-headed.  Your pain medicine is not helping. Get help right away if you:  Have a fever.  Faint.  Have increasing   pain in your abdomen.  Have severe pain in one or both of your shoulders.  Have fluid or blood coming from your sutures or from your vagina.  Have shortness of breath or difficulty breathing.  Have chest pain or leg pain.  Have ongoing nausea, vomiting, or diarrhea. Summary  After the procedure, it is common to have mild discomfort or cramping in your abdomen.  Take over-the-counter and prescription medicines only as told by your health care provider.  Watch for symptoms that should  prompt you to call your health care provider.  Keep all follow-up visits as told by your health care provider. This is important. This information is not intended to replace advice given to you by your health care provider. Make sure you discuss any questions you have with your health care provider. Document Revised: 08/22/2018 Document Reviewed: 02/07/2018 Elsevier Patient Education  2020 [employer] Inc[employer]  

## 2019-07-17 NOTE — H&P (Addendum)
Preoperative History and Physical  Shelly Hancock is a 29 y.o. [id number] here for surgical management of elective permanent sterilization, with removal of Nexplanon.   No significant preoperative concerns. She has not had a menses in several months til just this morning, due to Nexplanon suppression of menses.  She has bleeding this morning lightly  She understands the permanency of requested sterilization, the reason for salpingectomy which is cancer risk reduction as a benefit in addition to sterilization.  She understands and acknowledges the irreversibility of bilateral salpingectomy  Proposed surgery: Laparoscopic bilateral salpingectomy, removal of nexplanon left upper arm      Past Medical History:  Diagnosis Date  . Eczema   . Headache(784.0)    Migraines  . Hypertension    with this pregnancy  . Postpartum depression   . Urinary tract infection   . Vaginal Pap smear, abnormal         Past Surgical History:  Procedure Laterality Date  . NO PAST SURGERIES                     OB History  Gravida Para Term Preterm AB Living  2 2 2  0 0 2  SAB TAB Ectopic Multiple Live Births     0 0 0 0 2       # Outcome Date GA Lbr Len/2nd Weight Sex Delivery Anes PTL Lv  2 Term 02/12/17 [redacted]w[redacted]d 04:34 / 01:31 7 lb 7 oz (3.374 kg) M Vag-Spont EPI  LIV  1 Term 11/02/11 [redacted]w[redacted]d 14:15 / 01:00 7 lb 3.9 oz (3.286 kg) M Vag-Spont EPI  LIV  Patient denies any other pertinent gynecologic issues.         Current Outpatient Medications on File Prior to Visit  Medication Sig Dispense Refill  . etonogestrel (NEXPLANON) 68 MG IMPL implant 1 each by Subdermal route once.     No current facility-administered medications on file prior to visit.   No Known Allergies  Social History:   reports that she has never smoked. She has never used smokeless tobacco. She reports current alcohol use. She reports that she does not use drugs.       Family History  Problem Relation Age  of Onset  . Diabetes Paternal Grandmother   . Heart disease Paternal Grandmother   . Cancer Maternal Grandmother        colon  . Heart attack Father   . Hypertension Mother   . Irritable bowel syndrome Son   . Crohn's disease Son   . Cancer Maternal Aunt        skin  . Cancer Maternal Uncle        lung cancer  . Other Neg Hx     Review of Systems: Noncontributory  PHYSICAL EXAM: Blood pressure 139/89, pulse 88, height 5\' 5"  (1.651 m), weight 257 lb 12.8 oz (116.9 kg).  Overweight Body mass index is 42.9 kg/m.  General appearance - alert, well appearing, and in no distress Chest - clear to auscultation, no wheezes, rales or rhonchi, symmetric air entry Heart - normal rate and regular rhythm Abdomen - soft, nontender, nondistended, no masses or organomegaly umbilicus is nontender no prior surgeries                     Lower abdominal pannus with area that would allow easy access to the lower abdomen beneath the pannus Pelvic - examination limited.  External genitalia normal vagina with light menstrual bleeding.  GC  and Chlamydia culture obtained  Digital bimanual exam shows nontender cervix well supported adnexal structures nontender bimanual and unable to palpate uterus suggesting normal small size Extremities - peripheral pulses normal, no pedal edema, no clubbing or cyanosis  Labs: CBC    Component Value Date/Time   WBC 6.7 07/13/2019 1011   RBC 4.27 07/13/2019 1011   HGB 13.0 07/13/2019 1011   HGB 11.3 02/08/2017 1629   HCT 38.8 07/13/2019 1011   HCT 34.0 02/08/2017 1629   PLT 269 07/13/2019 1011   PLT 217 02/08/2017 1629   MCV 90.9 07/13/2019 1011   MCV 88 02/08/2017 1629   MCH 30.4 07/13/2019 1011   MCHC 33.5 07/13/2019 1011   RDW 12.2 07/13/2019 1011   RDW 13.9 02/08/2017 1629   CMP     Component Value Date/Time   NA 137 07/13/2019 1011   NA 138 02/08/2017 1629   K 3.8 07/13/2019 1011   CL 103 07/13/2019 1011   CO2 24 07/13/2019 1011    GLUCOSE 110 (H) 07/13/2019 1011   BUN 13 07/13/2019 1011   BUN 7 02/08/2017 1629   CREATININE 0.59 07/13/2019 1011   CALCIUM 9.0 07/13/2019 1011   PROT 7.3 07/13/2019 1011   PROT 6.1 02/08/2017 1629   ALBUMIN 4.0 07/13/2019 1011   ALBUMIN 3.6 02/08/2017 1629   AST 20 07/13/2019 1011   ALT 21 07/13/2019 1011   ALKPHOS 97 07/13/2019 1011   BILITOT 0.7 07/13/2019 1011   BILITOT <0.2 02/08/2017 1629   GFRNONAA >60 07/13/2019 1011   GFRAA >60 07/13/2019 1011   Serum pregnancy test negative on 07/13/2019  Imaging Studies: Imaging Results  No results found.    Assessment:      Patient Active Problem List   Diagnosis Date Noted  . Elevated BP without diagnosis of hypertension 11/18/2016    Priority: Low  . Sterilization consult 05/08/2019  . Nexplanon insertion 03/16/2017  . GBS bacteriuria 10/15/2016  . History of postpartum depression 09/22/2016    Plan: Patient will undergo surgical management with laparoscopic bilateral salpingectomy, removal of Nexplanon implant from left upper inner arm.   [name], MD

## 2019-07-17 NOTE — Brief Op Note (Addendum)
07/17/2019  9:34 AM  PATIENT:  Shelly Hancock  29 y.o. female  PRE-OPERATIVE DIAGNOSIS:  Sterilization, elective permanent.  #2 removal of Nexplanon left arm  POST-OPERATIVE DIAGNOSIS:  Sterilization, elective permanent.  #2 removal Nexplanon left arm  PROCEDURE:  Procedure(s): LAPAROSCOPIC BILATERAL SALPINGECTOMY (Bilateral) REMOVAL OF NON VAGINAL CONTRACEPTIVE DEVICE, NEXPLANON (Left)  SURGEON:  Surgeon(s) and Role:    [name], MD - Primary  PHYSICIAN ASSISTANT:   ASSISTANTS: Laurena Bering, CST  ANESTHESIA:   general  EBL:  10 mL   BLOOD ADMINISTERED:none  DRAINS: none   LOCAL MEDICATIONS USED:    and NONE  SPECIMEN:  Source of Specimen:  Bilateral fallopian tubes.  Nexplanon discarded  DISPOSITION OF SPECIMEN:  PATHOLOGY  COUNTS:  YES  TOURNIQUET:  * No tourniquets in log *  DICTATION: .Dragon Dictation  PLAN OF CARE: Discharge to home after PACU  PATIENT DISPOSITION:  PACU - hemodynamically stable.   Delay start of Pharmacological VTE agent (>24hrs) due to surgical blood loss or risk of bleeding: not applicable Details of procedure: Patient was taken the operating prepped and draped for combined abdominal and vaginal procedure with legs in yellowfin low leg support.  Timeout was conducted and procedure confirmed by operative team.  Speculum was inserted, single-tooth tenaculum used to grasp the cervix, and out catheterization of bladder performed obtaining 200 cc of clear urine.  Attention was then directed to the umbilicus where a midline vertical infraumbilical 1 cm incision was made as well as a suprapubic transverse incision of similar length and a right lower quadrant incision.  The hemostat was used to retract the umbilical stalk and the Veress needle was used to enter insufflate the abdomen while holding the abdomen upward and orienting the Veress needle toward the pelvis.  Loss-of-resistance technique was used with water droplet confirmation of  intraperitoneal location.  Insufflation pressure began at 6 mmHg pressure.  After 3 L CO2 inspection with the 3 mm camera showed no evidence of any abnormality associated with peritoneal entry.  Photos were taken to document.  Suprapubic and right lower quadrant trochars were inserted under direct laparoscopic visualization.,  With the patient in Trendelenburg position the uterus which was anteverted could be identified.  Ovaries were normal and tubes were normal.  Photodocumentation was performed.  The left fallopian tube was removed using harmonic Ace 7 coagulation device to transect and remove the tube whic as extracted through the suprapubic port.  the right fallopian tube was treated similarly. 120 cc of saline was instilled in the abdomen followed by deflation of the abdomen removal of laparoscopic trochars, and subcuticular 4-0 Vicryl closure of the skin incision and Steri-Strip placement.  Vaginal trochars were removed.  Nexplanon removal.  The left arm was then prepped, with Betadine, draped with green towels, and the distal tip of the intact Nexplanon was identified and pushed up against the skin with a 3 mm skin opening made directly over the tip of the Nexplanon which was easily expressed and extracted Steri-Strip was placed followed by Band-Aid and patient went to the recovery room in good condition. She did have an unexpected amount of initial discomfort related to the trocar sites and will be given analgesics in recovery room.  Again the procedure itself was considered technically very straightforward.

## 2019-07-18 LAB — SURGICAL PATHOLOGY

## 2019-07-18 NOTE — Progress Notes (Signed)
Both tubes removed, normal on evaluation by pathology

## 2019-07-24 NOTE — Telephone Encounter (Signed)
See tele note

## 2019-07-25 ENCOUNTER — Telehealth: Payer: Self-pay | Admitting: Obstetrics and Gynecology

## 2019-07-25 NOTE — Telephone Encounter (Signed)
Left message, unable to reach by phone again. I gave pt my cell number if she has concerns during postop time.

## 2019-07-25 NOTE — Telephone Encounter (Signed)
Pt still has RLQ discomfort when she is upright and walking around. NO nausea or vomiting or anorexia. Pt has no fever.  Pt declines any further eval at this time, such as CT abdomen, but she will call back if not better by next week.  She has appt end of next week.

## 2019-07-30 ENCOUNTER — Encounter: Payer: Medicaid Other | Admitting: Obstetrics and Gynecology

## 2019-08-01 ENCOUNTER — Encounter: Payer: Medicaid Other | Admitting: Obstetrics and Gynecology

## 2019-08-07 ENCOUNTER — Other Ambulatory Visit: Payer: Self-pay

## 2019-08-07 ENCOUNTER — Ambulatory Visit (INDEPENDENT_AMBULATORY_CARE_PROVIDER_SITE_OTHER): Payer: Medicaid Other | Admitting: Obstetrics and Gynecology

## 2019-08-07 ENCOUNTER — Encounter: Payer: Self-pay | Admitting: Obstetrics and Gynecology

## 2019-08-07 VITALS — BP 137/91 | HR 96 | Wt 256.0 lb

## 2019-08-07 DIAGNOSIS — Z09 Encounter for follow-up examination after completed treatment for conditions other than malignant neoplasm: Secondary | ICD-10-CM

## 2019-08-07 DIAGNOSIS — N920 Excessive and frequent menstruation with regular cycle: Secondary | ICD-10-CM

## 2019-08-07 MED ORDER — MEGESTROL ACETATE 40 MG PO TABS: 40.0000 mg | ORAL_TABLET | Freq: Three times a day (TID) | ORAL | 2 refills | Status: DC

## 2019-08-07 NOTE — Progress Notes (Signed)
PATIENT ID: Shelly Hancock, female     DOB: 1991/02/12, 29 y.o.     MRN: [id number]  Subjective:  Shelly Hancock is a 29 y.o. female now 3 weeks status post laparoscopic bilateral salpingectomy and nexplanon removal.   She notes that she has been having excessive bleeding with her menstrual cycle. She has been menstruating for the last two weeks and can fill up a large overnight pad in 30 minutes. She notes that she went several months without a period prior to her surgery. She was on Nexplanon prior to surgery.  Review of Systems Negative except excessive menstrual bleeding   Diet:   normal   Bowel movements : normal.  Pain is controlled without any medications.  Objective:  There were no vitals taken for this visit. General:Well developed, well nourished.  No acute distress. Abdomen: Bowel sounds normal, soft, non-tender. Pelvic Exam:    External Genitalia:  Normal.    Vagina: Small clots of bright red blood    Cervix: Normalnontender    Uterus: Tender to palpetation not PID tender, only dysmenorrhea     Adnexa/Bimanual: Normal  Incision(s): Healing well, no drainage, no erythema, no hernia, no swelling, no dehiscence     Assessment:  Post-Op 3 weeks s/p laparoscopic bilateral salpingectomy and nexplanon removal   stable postoperatively.  heavy first menses after removal on nexplanon and BTS Plan:  1.Wound care discussed  n/a 2. Current medications: Rx Megace  3. Activity restrictions: none 4. Return to work: now. 5. Follow up in 2 week.by phone or mychart   By signing my name below, I, [employer], attest that this documentation has been prepared under the direction and in the presence of [name], MD. Electronically Signed: Mal Misty Medical Scribe. 08/07/19. 2:25 PM.  I personally performed the services described in this documentation, which was SCRIBED in my presence. The recorded information has been reviewed and considered accurate. It has been edited as  necessary during review. [name], MD

## 2019-09-18 ENCOUNTER — Telehealth: Payer: Self-pay | Admitting: Obstetrics and Gynecology

## 2019-09-18 NOTE — Telephone Encounter (Signed)
Dr. Emelda Fear did surgery on her, patient states that she is having hot flashes and she has no desire to have intercourse with her spouse. When her period comes on she has so much pain, she thinks she might had endometrioses, patient states that her mother had it and her grandmother. Pt would like to speak with a nurse regarding this matter. Please contact pt

## 2019-09-19 ENCOUNTER — Telehealth: Payer: Self-pay | Admitting: *Deleted

## 2019-09-19 NOTE — Telephone Encounter (Signed)
LMOVM returning patient's call.  Advised she should make an appointment to discuss this issue with Dr Emelda Fear. Will attempt to reach patient tomorrow.

## 2019-09-19 NOTE — Telephone Encounter (Signed)
See MyChart note. Pt interested in endometrial ablation over IUD after a long review of both methods. She is to be scheduled for an u/s gyn pelvis completed with treansvaginal.

## 2019-10-03 ENCOUNTER — Other Ambulatory Visit: Payer: Self-pay | Admitting: Obstetrics and Gynecology

## 2019-10-03 DIAGNOSIS — N939 Abnormal uterine and vaginal bleeding, unspecified: Secondary | ICD-10-CM

## 2019-10-04 ENCOUNTER — Ambulatory Visit (INDEPENDENT_AMBULATORY_CARE_PROVIDER_SITE_OTHER): Payer: Medicaid Other

## 2019-10-04 ENCOUNTER — Other Ambulatory Visit: Payer: Self-pay

## 2019-10-04 DIAGNOSIS — N939 Abnormal uterine and vaginal bleeding, unspecified: Secondary | ICD-10-CM

## 2019-10-04 NOTE — Progress Notes (Signed)
PELVIC US TA/TV:homogeneous anteverted uterus,wnl,EEC 7.5 mm,1.7 x .6 x 1.5 cm heterogeneous echogenic mass within the endometrium, color stalk visualized (? Polyp),normal ovaries,ovaries appear mobile,pelvic pain during ultrasound,no free fluid   Chaperone Angie

## 2019-10-10 ENCOUNTER — Ambulatory Visit (INDEPENDENT_AMBULATORY_CARE_PROVIDER_SITE_OTHER): Payer: Medicaid Other | Admitting: Obstetrics and Gynecology

## 2019-10-10 ENCOUNTER — Encounter: Payer: Self-pay | Admitting: Obstetrics and Gynecology

## 2019-10-10 VITALS — BP 142/87 | HR 81 | Ht 65.0 in | Wt 259.8 lb

## 2019-10-10 DIAGNOSIS — N920 Excessive and frequent menstruation with regular cycle: Secondary | ICD-10-CM

## 2019-10-10 DIAGNOSIS — N939 Abnormal uterine and vaginal bleeding, unspecified: Secondary | ICD-10-CM

## 2019-10-10 DIAGNOSIS — N840 Polyp of corpus uteri: Secondary | ICD-10-CM | POA: Diagnosis not present

## 2019-10-10 NOTE — Progress Notes (Signed)
Patient ID: Shelly Hancock, female   DOB: 04-Sep-1990, 29 y.o.   MRN: [id number]    [facility] Clinic Visit  @DATE @            Patient name: Shelly Hancock MRN Shelly Hancock  Date of birth: 05-25-1990  CC & HPI:  Shelly Hancock is a 29 y.o. female presenting today to review the results of her transvaginal ultrasound on 10/04/2019 which was completed for AUB. She has taken Megace daily for the past month or so, but it is no longer working. The patient began heavily bleeding 3 days ago with large clots. She reports that the ultrasound was very painful. She sometimes has pain with intercourse. The patient denies fever, chills or any other symptoms or complaints at this time.   ROS:  ROS  + AUB + occasional dyspareunia - fever - chills All systems are negative except as noted in the HPI and PMH.   Pertinent History Reviewed:   Reviewed: Medical         Past Medical History:  Diagnosis Date  . Eczema   . Headache(784.0)    Migraines  . Hypertension    with this pregnancy  . Postpartum depression   . Urinary tract infection   . Vaginal Pap smear, abnormal                               Surgical Hx:    Past Surgical History:  Procedure Laterality Date  . LAPAROSCOPIC BILATERAL SALPINGECTOMY Bilateral 07/17/2019   Procedure: LAPAROSCOPIC BILATERAL SALPINGECTOMY;  Surgeon: 07/19/2019, MD;  Location: [facility];  Service: Gynecology;  Laterality: Bilateral;  . NO PAST SURGERIES    . REMOVAL OF NON VAGINAL CONTRACEPTIVE DEVICE Left 07/17/2019   Procedure: REMOVAL OF NON VAGINAL CONTRACEPTIVE DEVICE, NEXPLANON;  Surgeon: 07/19/2019, MD;  Location: [facility];  Service: Gynecology;  Laterality: Left;   Medications: Reviewed & Updated - see associated section                       Current Outpatient Medications:  .  Acetaminophen-Caff-Pyrilamine (MIDOL MAX ST MENSTRUAL) 500-60-15 MG TABS, Take 1-2 tablets by mouth 3 (three) times daily as needed (Menstrual cramps). , Disp: , Rfl:  .   buPROPion (WELLBUTRIN XL) 300 MG 24 hr tablet, Take by mouth., Disp: , Rfl:  .  ELDERBERRY PO, Take 1 tablet by mouth daily. GUMMIES, Disp: , Rfl:  .  megestrol (MEGACE) 40 MG tablet, Take 1 tablet (40 mg total) by mouth 3 (three) times daily., Disp: 45 tablet, Rfl: 2   Social History: Reviewed -  reports that she has never smoked. She has never used smokeless tobacco.  Objective Findings:  Vitals: Blood pressure (!) 142/87, pulse 81, height 5\' 5"  (1.651 m), weight 259 lb 12.8 oz (117.8 kg).  PHYSICAL EXAMINATION General appearance - alert, well appearing, and in no distress, oriented to person, place, and time and overweight Mental status - alert, oriented to person, place, and time, normal mood, behavior, speech, dress, motor activity, and thought processes, affect appropriate to mood  PELVIC DEFERRED  Assessment & Plan:   A:  1. AUB 2. Endometrial polyp -- reviewed ultrasound images with the patient  P:  1.  hysteroscopy, D&C removal of endometrial polyp, and [place] Endometrial ablation to be scheduled  By signing my name below, I, [name], attest that this documentation has been prepared under the  direction and in the presence of [name], MD. Electronically Signed: Pietro Cassis, Medical Scribe. 10/10/19. 2:04 PM.  I personally performed the services described in this documentation, which was SCRIBED in my presence. The recorded information has been reviewed and considered accurate. It has been edited as necessary during review. [name], MD

## 2019-10-18 NOTE — Patient Instructions (Signed)
Shelly Hancock  10/18/2019     @PREFPERIOPPHARMACY @   Your procedure is scheduled on   10/23/2019   Report to [facility] at  0740  A.M.  Call this number if you have problems the morning of surgery:  [phone]   Remember:  Do not eat or drink after midnight.                        Take these medicines the morning of surgery with A SIP OF WATER None    Do not wear jewelry, make-up or nail polish.  Do not wear lotions, powders, or perfumes. Please wear deodorant and brush your teeth.  Do not shave 48 hours prior to surgery.  Men may shave face and neck.  Do not bring valuables to the hospital.  [facility] is not responsible for any belongings or valuables.  Contacts, dentures or bridgework may not be worn into surgery.  Leave your suitcase in the car.  After surgery it may be brought to your room.  For patients admitted to the hospital, discharge time will be determined by your treatment team.  Patients discharged the day of surgery will not be allowed to drive home.   Name and phone number of your driver:   family Special instructions:  DO NOT smoke the morning of your procedure.  Please read over the following fact sheets that you were given. Anesthesia Post-op Instructions and Care and Recovery After Surgery       Hysteroscopy, Care After This sheet gives you information about how to care for yourself after your procedure. Your health care provider may also give you more specific instructions. If you have problems or questions, contact your health care provider. What can I expect after the procedure? After the procedure, it is common to have:  Cramping.  Bleeding. This can vary from light spotting to menstrual-like bleeding. Follow these instructions at home: Activity  Rest for 1-2 days after the procedure.  Do not douche, use tampons, or have sex for 2 weeks after the procedure, or until your health care provider approves.  Do not drive for  24 hours after the procedure, or for as long as told by your health care provider.  Do not drive, use heavy machinery, or drink alcohol while taking prescription pain medicines. Medicines   Take over-the-counter and prescription medicines only as told by your health care provider.  Do not take aspirin during recovery. It can increase the risk of bleeding. General instructions  Do not take baths, swim, or use a hot tub until your health care provider approves. Take showers instead of baths for 2 weeks, or for as long as told by your health care provider.  To prevent or treat constipation while you are taking prescription pain medicine, your health care provider may recommend that you: ? Drink enough fluid to keep your urine clear or pale yellow. ? Take over-the-counter or prescription medicines. ? Eat foods that are high in fiber, such as fresh fruits and vegetables, whole grains, and beans. ? Limit foods that are high in fat and processed sugars, such as fried and sweet foods.  Keep all follow-up visits as told by your health care provider. This is important. Contact a health care provider if:  You feel dizzy or lightheaded.  You feel nauseous.  You have abnormal vaginal discharge.  You have a rash.  You have pain that does not  get better with medicine.  You have chills. Get help right away if:  You have bleeding that is heavier than a normal menstrual period.  You have a fever.  You have pain or cramps that get worse.  You develop new abdominal pain.  You faint.  You have pain in your shoulders.  You have shortness of breath. Summary  After the procedure, you may have cramping and some vaginal bleeding.  Do not douche, use tampons, or have sex for 2 weeks after the procedure, or until your health care provider approves.  Do not take baths, swim, or use a hot tub until your health care provider approves. Take showers instead of baths for 2 weeks, or for as long  as told by your health care provider.  Report any unusual symptoms to your health care provider.  Keep all follow-up visits as told by your health care provider. This is important. This information is not intended to replace advice given to you by your health care provider. Make sure you discuss any questions you have with your health care provider. Document Revised: 02/25/2017 Document Reviewed: 04/13/2016 Elsevier Patient Education  2020 Elsevier Inc.  Dilation and Curettage or Vacuum Curettage, Care After These instructions give you information about caring for yourself after your procedure. Your doctor may also give you more specific instructions. Call your doctor if you have any problems or questions after your procedure. Follow these instructions at home: Activity  Do not drive or use heavy machinery while taking prescription pain medicine.  For 24 hours after your procedure, avoid driving.  Take short walks often, followed by rest periods. Ask your doctor what activities are safe for you. After one or two days, you may be able to return to your normal activities.  Do not lift anything that is heavier than 10 lb (4.5 kg) until your doctor approves.  For at least 2 weeks, or as long as told by your doctor: ? Do not douche. ? Do not use tampons. ? Do not have sex. General instructions   Take over-the-counter and prescription medicines only as told by your doctor. This is very important if you take blood thinning medicine.  Do not take baths, swim, or use a hot tub until your doctor approves. Take showers instead of baths.  Wear compression stockings as told by your doctor.  It is up to you to get the results of your procedure. Ask your doctor when your results will be ready.  Keep all follow-up visits as told by your doctor. This is important. Contact a doctor if:  You have very bad cramps that get worse or do not get better with medicine.  You have very bad pain in  your belly (abdomen).  You cannot drink fluids without throwing up (vomiting).  You get pain in a different part of the area between your belly and thighs (pelvis).  You have bad-smelling discharge from your vagina.  You have a rash. Get help right away if:  You are bleeding a lot from your vagina. A lot of bleeding means soaking more than one sanitary pad in an hour, for 2 hours in a row.  You have clumps of blood (blood clots) coming from your vagina.  You have a fever or chills.  Your belly feels very tender or hard.  You have chest pain.  You have trouble breathing.  You cough up blood.  You feel dizzy.  You feel light-headed.  You pass out (faint).  You have  pain in your neck or shoulder area. Summary  Take short walks often, followed by rest periods. Ask your doctor what activities are safe for you. After one or two days, you may be able to return to your normal activities.  Do not lift anything that is heavier than 10 lb (4.5 kg) until your doctor approves.  Do not take baths, swim, or use a hot tub until your doctor approves. Take showers instead of baths.  Contact your doctor if you have any symptoms of infection, like bad-smelling discharge from your vagina. This information is not intended to replace advice given to you by your health care provider. Make sure you discuss any questions you have with your health care provider. Document Revised: 02/25/2017 Document Reviewed: 12/01/2015 Elsevier Patient Education  2020 Elsevier Inc.  General Anesthesia, Adult, Care After This sheet gives you information about how to care for yourself after your procedure. Your health care provider may also give you more specific instructions. If you have problems or questions, contact your health care provider. What can I expect after the procedure? After the procedure, the following side effects are common:  Pain or discomfort at the IV site.  Nausea.  Vomiting.  Sore  throat.  Trouble concentrating.  Feeling cold or chills.  Weak or tired.  Sleepiness and fatigue.  Soreness and body aches. These side effects can affect parts of the body that were not involved in surgery. Follow these instructions at home:  For at least 24 hours after the procedure:  Have a responsible adult stay with you. It is important to have someone help care for you until you are awake and alert.  Rest as needed.  Do not: ? Participate in activities in which you could fall or become injured. ? Drive. ? Use heavy machinery. ? Drink alcohol. ? Take sleeping pills or medicines that cause drowsiness. ? Make important decisions or sign legal documents. ? Take care of children on your own. Eating and drinking  Follow any instructions from your health care provider about eating or drinking restrictions.  When you feel hungry, start by eating small amounts of foods that are soft and easy to digest (bland), such as toast. Gradually return to your regular diet.  Drink enough fluid to keep your urine pale yellow.  If you vomit, rehydrate by drinking water, juice, or clear broth. General instructions  If you have sleep apnea, surgery and certain medicines can increase your risk for breathing problems. Follow instructions from your health care provider about wearing your sleep device: ? Anytime you are sleeping, including during daytime naps. ? While taking prescription pain medicines, sleeping medicines, or medicines that make you drowsy.  Return to your normal activities as told by your health care provider. Ask your health care provider what activities are safe for you.  Take over-the-counter and prescription medicines only as told by your health care provider.  If you smoke, do not smoke without supervision.  Keep all follow-up visits as told by your health care provider. This is important. Contact a health care provider if:  You have nausea or vomiting that does not  get better with medicine.  You cannot eat or drink without vomiting.  You have pain that does not get better with medicine.  You are unable to pass urine.  You develop a skin rash.  You have a fever.  You have redness around your IV site that gets worse. Get help right away if:  You have difficulty breathing.  You have chest pain.  You have blood in your urine or stool, or you vomit blood. Summary  After the procedure, it is common to have a sore throat or nausea. It is also common to feel tired.  Have a responsible adult stay with you for the first 24 hours after general anesthesia. It is important to have someone help care for you until you are awake and alert.  When you feel hungry, start by eating small amounts of foods that are soft and easy to digest (bland), such as toast. Gradually return to your regular diet.  Drink enough fluid to keep your urine pale yellow.  Return to your normal activities as told by your health care provider. Ask your health care provider what activities are safe for you. This information is not intended to replace advice given to you by your health care provider. Make sure you discuss any questions you have with your health care provider. Document Revised: 03/18/2017 Document Reviewed: 10/29/2016 Elsevier Patient Education  2020 [employer]. How to Use Chlorhexidine for Bathing Chlorhexidine gluconate (CHG) is a germ-killing (antiseptic) solution that is used to clean the skin. It can get rid of the bacteria that normally live on the skin and can keep them away for about 24 hours. To clean your skin with CHG, you may be given:  A CHG solution to use in the shower or as part of a sponge bath.  A prepackaged cloth that contains CHG. Cleaning your skin with CHG may help lower the risk for infection:  While you are staying in the intensive care unit of the hospital.  If you have a vascular access, such as a central line, to provide short-term  or long-term access to your veins.  If you have a catheter to drain urine from your bladder.  If you are on a ventilator. A ventilator is a machine that helps you breathe by moving air in and out of your lungs.  After surgery. What are the risks? Risks of using CHG include:  A skin reaction.  Hearing loss, if CHG gets in your ears.  Eye injury, if CHG gets in your eyes and is not rinsed out.  The CHG product catching fire. Make sure that you avoid smoking and flames after applying CHG to your skin. Do not use CHG:  If you have a chlorhexidine allergy or have previously reacted to chlorhexidine.  On babies younger than 40 months of age. How to use CHG solution  Use CHG only as told by your health care provider, and follow the instructions on the label.  Use the full amount of CHG as directed. Usually, this is one bottle. During a shower Follow these steps when using CHG solution during a shower (unless your health care provider gives you different instructions): 1. Start the shower. 2. Use your normal soap and shampoo to wash your face and hair. 3. Turn off the shower or move out of the shower stream. 4. Pour the CHG onto a clean washcloth. Do not use any type of brush or rough-edged sponge. 5. Starting at your neck, lather your body down to your toes. Make sure you follow these instructions: ? If you will be having surgery, pay special attention to the part of your body where you will be having surgery. Scrub this area for at least 1 minute. ? Do not use CHG on your head or face. If the solution gets into your ears or eyes, rinse them well with water. ?  Avoid your genital area. ? Avoid any areas of skin that have broken skin, cuts, or scrapes. ? Scrub your back and under your arms. Make sure to wash skin folds. 6. Let the lather sit on your skin for 1-2 minutes or as long as told by your health care provider. 7. Thoroughly rinse your entire body in the shower. Make sure that  all body creases and crevices are rinsed well. 8. Dry off with a clean towel. Do not put any substances on your body afterward--such as powder, lotion, or perfume--unless you are told to do so by your health care provider. Only use lotions that are recommended by the manufacturer. 9. Put on clean clothes or pajamas. 10. If it is the night before your surgery, sleep in clean sheets.  During a sponge bath Follow these steps when using CHG solution during a sponge bath (unless your health care provider gives you different instructions): 1. Use your normal soap and shampoo to wash your face and hair. 2. Pour the CHG onto a clean washcloth. 3. Starting at your neck, lather your body down to your toes. Make sure you follow these instructions: ? If you will be having surgery, pay special attention to the part of your body where you will be having surgery. Scrub this area for at least 1 minute. ? Do not use CHG on your head or face. If the solution gets into your ears or eyes, rinse them well with water. ? Avoid your genital area. ? Avoid any areas of skin that have broken skin, cuts, or scrapes. ? Scrub your back and under your arms. Make sure to wash skin folds. 4. Let the lather sit on your skin for 1-2 minutes or as long as told by your health care provider. 5. Using a different clean, wet washcloth, thoroughly rinse your entire body. Make sure that all body creases and crevices are rinsed well. 6. Dry off with a clean towel. Do not put any substances on your body afterward--such as powder, lotion, or perfume--unless you are told to do so by your health care provider. Only use lotions that are recommended by the manufacturer. 7. Put on clean clothes or pajamas. 8. If it is the night before your surgery, sleep in clean sheets. How to use CHG prepackaged cloths  Only use CHG cloths as told by your health care provider, and follow the instructions on the label.  Use the CHG cloth on clean, dry  skin.  Do not use the CHG cloth on your head or face unless your health care provider tells you to.  When washing with the CHG cloth: ? Avoid your genital area. ? Avoid any areas of skin that have broken skin, cuts, or scrapes. Before surgery Follow these steps when using a CHG cloth to clean before surgery (unless your health care provider gives you different instructions): 1. Using the CHG cloth, vigorously scrub the part of your body where you will be having surgery. Scrub using a back-and-forth motion for 3 minutes. The area on your body should be completely wet with CHG when you are done scrubbing. 2. Do not rinse. Discard the cloth and let the area air-dry. Do not put any substances on the area afterward, such as powder, lotion, or perfume. 3. Put on clean clothes or pajamas. 4. If it is the night before your surgery, sleep in clean sheets.  For general bathing Follow these steps when using CHG cloths for general bathing (unless your health care provider  gives you different instructions). 1. Use a separate CHG cloth for each area of your body. Make sure you wash between any folds of skin and between your fingers and toes. Wash your body in the following order, switching to a new cloth after each step: ? The front of your neck, shoulders, and chest. ? Both of your arms, under your arms, and your hands. ? Your stomach and groin area, avoiding the genitals. ? Your right leg and foot. ? Your left leg and foot. ? The back of your neck, your back, and your buttocks. 2. Do not rinse. Discard the cloth and let the area air-dry. Do not put any substances on your body afterward--such as powder, lotion, or perfume--unless you are told to do so by your health care provider. Only use lotions that are recommended by the manufacturer. 3. Put on clean clothes or pajamas. Contact a health care provider if:  Your skin gets irritated after scrubbing.  You have questions about using your solution or  cloth. Get help right away if:  Your eyes become very red or swollen.  Your eyes itch badly.  Your skin itches badly and is red or swollen.  Your hearing changes.  You have trouble seeing.  You have swelling or tingling in your mouth or throat.  You have trouble breathing.  You swallow any chlorhexidine. Summary  Chlorhexidine gluconate (CHG) is a germ-killing (antiseptic) solution that is used to clean the skin. Cleaning your skin with CHG may help to lower your risk for infection.  You may be given CHG to use for bathing. It may be in a bottle or in a prepackaged cloth to use on your skin. Carefully follow your health care provider's instructions and the instructions on the product label.  Do not use CHG if you have a chlorhexidine allergy.  Contact your health care provider if your skin gets irritated after scrubbing. This information is not intended to replace advice given to you by your health care provider. Make sure you discuss any questions you have with your health care provider. Document Revised: 06/01/2018 Document Reviewed: 02/10/2017 Elsevier Patient Education  2020 [employer].

## 2019-10-21 ENCOUNTER — Other Ambulatory Visit: Payer: Self-pay | Admitting: Obstetrics and Gynecology

## 2019-10-21 NOTE — Progress Notes (Signed)
Patient ID: Shelly Hancock, female   DOB: 02/27/1991, 29 y.o.   MRN: [id number]    [facility] Clinic Visit  @DATE @            Patient name: Shelly Hancock         MRN Shelly Hancock  Date of birth: Sep 01, 1990  CC & HPI:  Shelly Hancock is a 29 y.o. female presenting today to review the results of her transvaginal ultrasound on 10/04/2019 which was completed for AUB. She has taken Megace daily for the past month or so, but it is no longer working. The patient began heavily bleeding 3 days ago with large clots. She reports that the ultrasound was very painful. She sometimes has pain with intercourse. The patient denies fever, chills or any other symptoms or complaints at this time.   ROS:  ROS  + AUB + occasional dyspareunia - fever - chills All systems are negative except as noted in the HPI and PMH.   Pertinent History Reviewed:   Reviewed: Medical             Past Medical History:  Diagnosis Date  . Eczema   . Headache(784.0)    Migraines  . Hypertension    with this pregnancy  . Postpartum depression   . Urinary tract infection   . Vaginal Pap smear, abnormal                               Surgical Hx:         Past Surgical History:  Procedure Laterality Date  . LAPAROSCOPIC BILATERAL SALPINGECTOMY Bilateral 07/17/2019   Procedure: LAPAROSCOPIC BILATERAL SALPINGECTOMY;  Surgeon: 07/19/2019, MD;  Location: [facility];  Service: Gynecology;  Laterality: Bilateral;  . NO PAST SURGERIES    . REMOVAL OF NON VAGINAL CONTRACEPTIVE DEVICE Left 07/17/2019   Procedure: REMOVAL OF NON VAGINAL CONTRACEPTIVE DEVICE, NEXPLANON;  Surgeon: 07/19/2019, MD;  Location: [facility];  Service: Gynecology;  Laterality: Left;   Medications: Reviewed & Updated - see associated section                       Current Outpatient Medications:  .  Acetaminophen-Caff-Pyrilamine (MIDOL MAX ST MENSTRUAL) 500-60-15 MG TABS, Take 1-2 tablets by mouth 3 (three) times daily as needed  (Menstrual cramps). , Disp: , Rfl:  .  buPROPion (WELLBUTRIN XL) 300 MG 24 hr tablet, Take by mouth., Disp: , Rfl:  .  ELDERBERRY PO, Take 1 tablet by mouth daily. GUMMIES, Disp: , Rfl:  .  megestrol (MEGACE) 40 MG tablet, Take 1 tablet (40 mg total) by mouth 3 (three) times daily., Disp: 45 tablet, Rfl: 2   Social History: Reviewed -  reports that she has never smoked. She has never used smokeless tobacco.  Objective Findings:  Vitals: Blood pressure (!) 142/87, pulse 81, height 5\' 5"  (1.651 m), weight 259 lb 12.8 oz (117.8 kg).  PHYSICAL EXAMINATION General appearance - alert, well appearing, and in no distress, oriented to person, place, and time and overweight Mental status - alert, oriented to person, place, and time, normal mood, behavior, speech, dress, motor activity, and thought processes, affect appropriate to mood  PELVIC:   GYNECOLOGIC SONOGRAM   Shelly Hancock is a 29 y.o. Shelly Hancock LMP 30 is here for a pelvic sonogram for abnormal uterine bleeding .  Uterus  6.9 x 4.3 x 4.7 cm, Total uterine volume 74 cc,homogeneous anteverted uterus,wnl  Endometrium          7.5 mm, symmetrical, 1.7 x .6 x 1.5 cm heterogeneous echogenic mass within the endometrium, color stalk visualized (? Polyp)  Right ovary             2.3 x 1.9 x 1.6 cm, wnl  Left ovary                2.6 x 2.5 x 1.8 cm, wnl  No free fluid   Technician Comments:  PELVIC US TA/TV:homogeneous anteverted uterus,wnl,EEC 7.5 mm,1.7 x .6 x 1.5 cm heterogeneous echogenic mass within the endometrium, color stalk visualized (? Polyp),normal ovaries,ovaries appear mobile,pelvic pain during ultrasound,no free fluid   Chaperone Angie     Amber Flora Lipps 10/04/2019 2:06 PM  Clinical Impression and recommendations:  I have reviewed the sonogram results above, combined with the patient's current clinical course, below are my impressions and any appropriate recommendations  for management based on the sonographic findings.  Uterus is normal size shape and contour Endometrium normal thickness with small polyp noted Both ovaries are normal   Lazaro Arms 10/15/2019 7:17 AM     Assessment & Plan:   A:  1. AUB 2. Endometrial polyp -- reviewed ultrasound images with the patient  P:  1.  hysteroscopy, D&C removal of endometrial polyp, and [facility] Endometrial ablation  Scheduled for 10/23/2019  By signing my name below, I, Pietro Cassis, attest that this documentation has been prepared under the direction and in the presence of [name], MD. Electronically Signed: Pietro Cassis, Medical Scribe. 10/10/19. 2:04 PM.  I personally performed the services described in this documentation, which was SCRIBED in my presence. The recorded information has been reviewed and considered accurate. It has been edited as necessary during review. [name], MD

## 2019-10-22 ENCOUNTER — Other Ambulatory Visit (HOSPITAL_COMMUNITY)
Admission: RE | Admit: 2019-10-22 | Discharge: 2019-10-22 | Disposition: A | Discharge status: Home / Self Care | Payer: Medicaid Other | Source: Ambulatory Visit | Attending: Obstetrics and Gynecology | Admitting: Obstetrics and Gynecology

## 2019-10-22 ENCOUNTER — Encounter (HOSPITAL_COMMUNITY)
Admission: RE | Admit: 2019-10-22 | Discharge: 2019-10-22 | Disposition: A | Discharge status: Home / Self Care | Payer: Medicaid Other | Source: Ambulatory Visit | Attending: Obstetrics and Gynecology | Admitting: Obstetrics and Gynecology

## 2019-10-22 ENCOUNTER — Other Ambulatory Visit: Payer: Self-pay

## 2019-10-22 ENCOUNTER — Encounter (HOSPITAL_COMMUNITY): Payer: Self-pay

## 2019-10-22 DIAGNOSIS — Z01812 Encounter for preprocedural laboratory examination: Secondary | ICD-10-CM | POA: Diagnosis not present

## 2019-10-22 DIAGNOSIS — Z20822 Contact with and (suspected) exposure to covid-19: Secondary | ICD-10-CM | POA: Insufficient documentation

## 2019-10-22 LAB — COMPREHENSIVE METABOLIC PANEL
ALT: 24 U/L (ref 0–44)
AST: 20 U/L (ref 15–41)
Albumin: 4.1 g/dL (ref 3.5–5.0)
Alkaline Phosphatase: 84 U/L (ref 38–126)
Anion gap: 10 (ref 5–15)
BUN: 11 mg/dL (ref 6–20)
CO2: 21 mmol/L — ABNORMAL LOW (ref 22–32)
Calcium: 9.1 mg/dL (ref 8.9–10.3)
Chloride: 107 mmol/L (ref 98–111)
Creatinine, Ser: 0.64 mg/dL (ref 0.44–1.00)
GFR calc Af Amer: >60 mL/min (ref >60)
GFR calc non Af Amer: >60 mL/min (ref >60)
Glucose, Bld: 102 mg/dL — ABNORMAL HIGH (ref 70–99)
Potassium: 3.6 mmol/L (ref 3.5–5.1)
Sodium: 138 mmol/L (ref 135–145)
Total Bilirubin: 0.5 mg/dL (ref 0.3–1.2)
Total Protein: 7.4 g/dL (ref 6.5–8.1)

## 2019-10-22 LAB — CBC
HCT: 38.3 % (ref 36.0–46.0)
Hemoglobin: 12.8 g/dL (ref 12.0–15.0)
MCH: 30 pg (ref 26.0–34.0)
MCHC: 33.4 g/dL (ref 30.0–36.0)
MCV: 89.7 fL (ref 80.0–100.0)
Platelets: 272 10*3/uL (ref 150–400)
RBC: 4.27 MIL/uL (ref 3.87–5.11)
RDW: 12.3 % (ref 11.5–15.5)
WBC: 6.5 10*3/uL (ref 4.0–10.5)
nRBC: 0 % (ref 0.0–0.2)

## 2019-10-22 LAB — URINALYSIS, ROUTINE W REFLEX MICROSCOPIC
Bacteria, UA: NONE SEEN
Bilirubin Urine: NEGATIVE
Glucose, UA: NEGATIVE mg/dL
Ketones, ur: NEGATIVE mg/dL
Leukocytes,Ua: NEGATIVE
Nitrite: NEGATIVE
Protein, ur: NEGATIVE mg/dL
Specific Gravity, Urine: 1.021 (ref 1.005–1.030)
pH: 6 (ref 5.0–8.0)

## 2019-10-22 LAB — HCG, QUANTITATIVE, PREGNANCY: hCG, Beta Chain, Quant, S: <1 m[IU]/mL (ref <5)

## 2019-10-22 LAB — SARS CORONAVIRUS 2 (TAT 6-24 HRS): SARS Coronavirus 2: NEGATIVE

## 2019-10-23 ENCOUNTER — Ambulatory Visit (HOSPITAL_COMMUNITY): Payer: Medicaid Other | Admitting: Anesthesiology

## 2019-10-23 ENCOUNTER — Ambulatory Visit (HOSPITAL_COMMUNITY)
Admission: RE | Admit: 2019-10-23 | Discharge: 2019-10-23 | Disposition: A | Discharge status: Home / Self Care | Payer: Medicaid Other | Attending: Obstetrics and Gynecology | Admitting: Obstetrics and Gynecology

## 2019-10-23 ENCOUNTER — Encounter (HOSPITAL_COMMUNITY)
Admission: RE | Disposition: A | Discharge status: Home / Self Care | Payer: Self-pay | Source: Home / Self Care | Attending: Obstetrics and Gynecology

## 2019-10-23 ENCOUNTER — Other Ambulatory Visit: Payer: Self-pay

## 2019-10-23 ENCOUNTER — Encounter (HOSPITAL_COMMUNITY): Payer: Self-pay | Admitting: Obstetrics and Gynecology

## 2019-10-23 DIAGNOSIS — R519 Headache, unspecified: Secondary | ICD-10-CM | POA: Insufficient documentation

## 2019-10-23 DIAGNOSIS — N920 Excessive and frequent menstruation with regular cycle: Secondary | ICD-10-CM | POA: Insufficient documentation

## 2019-10-23 DIAGNOSIS — I10 Essential (primary) hypertension: Secondary | ICD-10-CM | POA: Diagnosis not present

## 2019-10-23 DIAGNOSIS — N840 Polyp of corpus uteri: Secondary | ICD-10-CM

## 2019-10-23 DIAGNOSIS — Z87440 Personal history of urinary (tract) infections: Secondary | ICD-10-CM | POA: Diagnosis not present

## 2019-10-23 DIAGNOSIS — F329 Major depressive disorder, single episode, unspecified: Secondary | ICD-10-CM | POA: Diagnosis not present

## 2019-10-23 DIAGNOSIS — Z79899 Other long term (current) drug therapy: Secondary | ICD-10-CM | POA: Insufficient documentation

## 2019-10-23 DIAGNOSIS — Z9079 Acquired absence of other genital organ(s): Secondary | ICD-10-CM | POA: Diagnosis not present

## 2019-10-23 DIAGNOSIS — Z6841 Body Mass Index (BMI) 40.0 and over, adult: Secondary | ICD-10-CM | POA: Insufficient documentation

## 2019-10-23 DIAGNOSIS — N939 Abnormal uterine and vaginal bleeding, unspecified: Secondary | ICD-10-CM | POA: Diagnosis present

## 2019-10-23 HISTORY — PX: POLYPECTOMY: SHX5525

## 2019-10-23 HISTORY — PX: DILATATION AND CURETTAGE/HYSTEROSCOPY WITH MINERVA: SHX6851

## 2019-10-23 SURGERY — DILATATION AND CURETTAGE/HYSTEROSCOPY WITH MINERVA
Anesthesia: General | Site: Uterus

## 2019-10-23 MED ORDER — EPHEDRINE 5 MG/ML INJ
INTRAVENOUS | Status: AC
  Filled 2019-10-23: qty 10

## 2019-10-23 MED ORDER — CEFAZOLIN SODIUM-DEXTROSE 2-4 GM/100ML-% IV SOLN: 2.0000 g | Freq: Once | INTRAVENOUS | Status: DC

## 2019-10-23 MED ORDER — MEPERIDINE HCL 50 MG/ML IJ SOLN: 6.2500 mg | INTRAMUSCULAR | Status: DC | PRN

## 2019-10-23 MED ORDER — FENTANYL CITRATE (PF) 100 MCG/2ML IJ SOLN
INTRAMUSCULAR | Status: AC
  Filled 2019-10-23: qty 2

## 2019-10-23 MED ORDER — MIDAZOLAM HCL 5 MG/5ML IJ SOLN
INTRAMUSCULAR | Status: DC | PRN
  Administered 2019-10-23: 2 mg via INTRAVENOUS

## 2019-10-23 MED ORDER — PROPOFOL 10 MG/ML IV BOLUS
INTRAVENOUS | Status: AC
  Filled 2019-10-23: qty 40

## 2019-10-23 MED ORDER — CHLORHEXIDINE GLUCONATE 0.12 % MT SOLN
15.0000 mL | Freq: Once | OROMUCOSAL | Status: AC
  Administered 2019-10-23: 15 mL via OROMUCOSAL

## 2019-10-23 MED ORDER — HYDROMORPHONE HCL 1 MG/ML IJ SOLN
0.2500 mg | INTRAMUSCULAR | Status: DC | PRN
  Administered 2019-10-23: 0.5 mg via INTRAVENOUS
  Filled 2019-10-23: qty 0.5

## 2019-10-23 MED ORDER — SODIUM CHLORIDE 0.9 % IR SOLN
Status: DC | PRN
  Administered 2019-10-23: 3000 mL

## 2019-10-23 MED ORDER — POVIDONE-IODINE 10 % EX SWAB: 2.0000 "application " | Freq: Once | CUTANEOUS | Status: DC

## 2019-10-23 MED ORDER — BUPIVACAINE-EPINEPHRINE (PF) 0.5% -1:200000 IJ SOLN
INTRAMUSCULAR | Status: AC
  Filled 2019-10-23: qty 30

## 2019-10-23 MED ORDER — SODIUM CHLORIDE FLUSH 0.9 % IV SOLN
INTRAVENOUS | Status: AC
  Filled 2019-10-23: qty 10

## 2019-10-23 MED ORDER — ONDANSETRON HCL 4 MG/2ML IJ SOLN
INTRAMUSCULAR | Status: AC
  Filled 2019-10-23: qty 2

## 2019-10-23 MED ORDER — DEXAMETHASONE SODIUM PHOSPHATE 4 MG/ML IJ SOLN
INTRAMUSCULAR | Status: DC | PRN
  Administered 2019-10-23: 10 mg via INTRAVENOUS

## 2019-10-23 MED ORDER — LIDOCAINE 2% (20 MG/ML) 5 ML SYRINGE
INTRAMUSCULAR | Status: DC | PRN
  Administered 2019-10-23: 100 mg via INTRAVENOUS

## 2019-10-23 MED ORDER — FENTANYL CITRATE (PF) 100 MCG/2ML IJ SOLN
INTRAMUSCULAR | Status: DC | PRN
  Administered 2019-10-23 (×2): 50 ug via INTRAVENOUS
  Administered 2019-10-23: 25 ug via INTRAVENOUS
  Administered 2019-10-23: 75 ug via INTRAVENOUS

## 2019-10-23 MED ORDER — BUPIVACAINE-EPINEPHRINE (PF) 0.5% -1:200000 IJ SOLN
INTRAMUSCULAR | Status: DC | PRN
  Administered 2019-10-23: 20 mL

## 2019-10-23 MED ORDER — SCOPOLAMINE 1 MG/3DAYS TD PT72
MEDICATED_PATCH | TRANSDERMAL | Status: AC
  Filled 2019-10-23: qty 1

## 2019-10-23 MED ORDER — CEFAZOLIN SODIUM-DEXTROSE 2-4 GM/100ML-% IV SOLN
2.0000 g | Freq: Once | INTRAVENOUS | Status: AC
  Administered 2019-10-23: 2 g via INTRAVENOUS

## 2019-10-23 MED ORDER — ONDANSETRON HCL 4 MG/2ML IJ SOLN
INTRAMUSCULAR | Status: DC | PRN
  Administered 2019-10-23: 4 mg via INTRAVENOUS

## 2019-10-23 MED ORDER — MIDAZOLAM HCL 2 MG/2ML IJ SOLN
INTRAMUSCULAR | Status: AC
  Filled 2019-10-23: qty 2

## 2019-10-23 MED ORDER — PROPOFOL 10 MG/ML IV BOLUS
INTRAVENOUS | Status: DC | PRN
  Administered 2019-10-23: 100 mg via INTRAVENOUS
  Administered 2019-10-23: 200 mg via INTRAVENOUS

## 2019-10-23 MED ORDER — LACTATED RINGERS IV SOLN: INTRAVENOUS | Status: DC | PRN

## 2019-10-23 MED ORDER — SCOPOLAMINE 1 MG/3DAYS TD PT72
1.0000 | MEDICATED_PATCH | TRANSDERMAL | Status: DC
  Administered 2019-10-23: 1.5 mg via TRANSDERMAL

## 2019-10-23 MED ORDER — PROMETHAZINE HCL 25 MG/ML IJ SOLN
INTRAMUSCULAR | Status: AC
  Filled 2019-10-23: qty 1

## 2019-10-23 MED ORDER — ORAL CARE MOUTH RINSE: 15.0000 mL | Freq: Once | OROMUCOSAL | Status: AC

## 2019-10-23 MED ORDER — LACTATED RINGERS IV SOLN
Freq: Once | INTRAVENOUS | Status: AC
  Administered 2019-10-23: 1000 mL via INTRAVENOUS

## 2019-10-23 MED ORDER — EPHEDRINE SULFATE-NACL 50-0.9 MG/10ML-% IV SOSY
PREFILLED_SYRINGE | INTRAVENOUS | Status: DC | PRN
  Administered 2019-10-23 (×3): 5 mg via INTRAVENOUS

## 2019-10-23 MED ORDER — PROMETHAZINE HCL 25 MG/ML IJ SOLN
6.2500 mg | INTRAMUSCULAR | Status: DC | PRN
  Administered 2019-10-23: 6.25 mg via INTRAVENOUS

## 2019-10-23 MED ORDER — DEXAMETHASONE SODIUM PHOSPHATE 10 MG/ML IJ SOLN
INTRAMUSCULAR | Status: AC
  Filled 2019-10-23: qty 1

## 2019-10-23 SURGICAL SUPPLY — 27 items
CLOTH BEACON ORANGE TIMEOUT ST (SAFETY) ×2 IMPLANT
COVER LIGHT HANDLE STERIS (MISCELLANEOUS) ×4 IMPLANT
COVER WAND RF STERILE (DRAPES) ×2 IMPLANT
DECANTER SPIKE VIAL GLASS SM (MISCELLANEOUS) ×2 IMPLANT
ELECT REM PT RETURN 9FT ADLT (ELECTROSURGICAL)
ELECTRODE REM PT RTRN 9FT ADLT (ELECTROSURGICAL) IMPLANT
GAUZE SPONGE 4X4 16PLY XRAY LF (GAUZE/BANDAGES/DRESSINGS) ×2 IMPLANT
GLOVE BIOGEL PI IND STRL 7.0 (GLOVE) ×2 IMPLANT
GLOVE BIOGEL PI IND STRL 9 (GLOVE) ×1 IMPLANT
GLOVE BIOGEL PI INDICATOR 7.0 (GLOVE) ×2
GLOVE BIOGEL PI INDICATOR 9 (GLOVE) ×1
GLOVE ECLIPSE 9.0 STRL (GLOVE) ×2 IMPLANT
GOWN SPEC L3 XXLG W/TWL (GOWN DISPOSABLE) ×2 IMPLANT
GOWN STRL REUS W/TWL LRG LVL3 (GOWN DISPOSABLE) ×2 IMPLANT
HANDPIECE ABLA MINERVA ENDO (MISCELLANEOUS) ×3 IMPLANT
INST SET HYSTEROSCOPY (KITS) ×2 IMPLANT
IV NS IRRIG 3000ML ARTHROMATIC (IV SOLUTION) ×2 IMPLANT
KIT TURNOVER CYSTO (KITS) ×2 IMPLANT
KIT TURNOVER KIT A (KITS) ×2 IMPLANT
MANIFOLD NEPTUNE II (INSTRUMENTS) ×2 IMPLANT
NS IRRIG 1000ML POUR BTL (IV SOLUTION) ×2 IMPLANT
PACK PERI GYN (CUSTOM PROCEDURE TRAY) ×2 IMPLANT
PAD ARMBOARD 7.5X6 YLW CONV (MISCELLANEOUS) ×2 IMPLANT
PAD TELFA 3X4 1S STER (GAUZE/BANDAGES/DRESSINGS) ×2 IMPLANT
SET BASIN LINEN APH (SET/KITS/TRAYS/PACK) ×2 IMPLANT
SET IRRIG Y TYPE TUR BLADDER L (SET/KITS/TRAYS/PACK) ×2 IMPLANT
SYR CONTROL 10ML LL (SYRINGE) ×2 IMPLANT

## 2019-10-23 NOTE — Brief Op Note (Signed)
10/23/2019  8:44 AM  PATIENT:  Shelly Hancock  28 y.o. female  PRE-OPERATIVE DIAGNOSIS:  Menorrhagia Endometrial Polyp  POST-OPERATIVE DIAGNOSIS:  Menorrhagia Endometrial Polyp  PROCEDURE:  Procedure(s): DILATATION AND CURETTAGE/HYSTEROSCOPY WITH MINERVA (N/A) POLYPECTOMY(Endometrial Polyp Removal) (N/A)  SURGEON:  Surgeon(s) and Role:    * Emberlynn Riggan V, MD - Primary  PHYSICIAN ASSISTANT:   ASSISTANTS: none   ANESTHESIA:   general, paracervical block and MAC  EBL:  3 mL   BLOOD ADMINISTERED:none  DRAINS: none   LOCAL MEDICATIONS USED:  MARCAINE    and Amount: 20 ml  SPECIMEN:  Source of Specimen:  1 endometrial polyp 2 endometrial curettings  DISPOSITION OF SPECIMEN:  PATHOLOGY  COUNTS:  YES, correct  TOURNIQUET:  * No tourniquets in log *  DICTATION: .Dragon Dictation  PLAN OF CARE: Discharge to home after PACU  PATIENT DISPOSITION:  PACU - hemodynamically stable.   Delay start of Pharmacological VTE agent (>24hrs) due to surgical blood loss or risk of bleeding: not applicable Details of procedure: Patient was taken the operating room prepped and draped for vaginal procedure with timeout conducted and procedure confirmed by operative team.  Ancef 2 g was administered preoperatively.   Speculum was inserted, cervix grasped with single-tooth tenaculum from its anteverted position we were able to place paracervical block at 3 and 5:00 as well as 7 and 9:00 a total of 20 cc followed by sounding of the uterus to 9 cm in the anteflexed position with dilation to 23 [place] allowing introduction of the 30 degree operative hysteroscope.  After initial visualization difficulties associated with the hysteroscope were corrected we were able to clearly visualize the endometrial cavity and there was a moderate sized approximately 1 cm polyp in the right cornual area.  Operative hysteroscope biopsy scissors were then used to amputate the polyp in 3 fragments which were  extracted and sent with to pathology.  Smooth sharp curettage was then performed..  Upon completion the specimen was sent to pathology as well.  At no time was any suspicion of complication. At this point the Minerva endometrial ablation device was prepared tested, inserted and activated at 4.5 cm length.  There was an adequate seal initially, the device was reset to 4 cm, and the balloon held at maximum insufflation, and on second effort the ablation sequence completed without difficulty..  There was minimal bleeding.  The ablation device had the appropriate char appearance to its outer surface.  Procedure was considered successfully completed, ultimate's final sponge count was normal.  Condition to recovery room stable 

## 2019-10-23 NOTE — Op Note (Signed)
10/23/2019  8:44 AM  PATIENT:  Shelly Hancock  29 y.o. female  PRE-OPERATIVE DIAGNOSIS:  Menorrhagia Endometrial Polyp  POST-OPERATIVE DIAGNOSIS:  Menorrhagia Endometrial Polyp  PROCEDURE:  Procedure(s): DILATATION AND CURETTAGE/HYSTEROSCOPY WITH MINERVA (N/A) POLYPECTOMY(Endometrial Polyp Removal) (N/A)  SURGEON:  Surgeon(s) and Role:    Jonnie Kind, MD - Primary  PHYSICIAN ASSISTANT:   ASSISTANTS: none   ANESTHESIA:   general, paracervical block and MAC  EBL:  3 mL   BLOOD ADMINISTERED:none  DRAINS: none   LOCAL MEDICATIONS USED:  MARCAINE    and Amount: 20 ml  SPECIMEN:  Source of Specimen:  1 endometrial polyp 2 endometrial curettings  DISPOSITION OF SPECIMEN:  PATHOLOGY  COUNTS:  YES, correct  TOURNIQUET:  * No tourniquets in log *  DICTATION: .Dragon Dictation  PLAN OF CARE: Discharge to home after PACU  PATIENT DISPOSITION:  PACU - hemodynamically stable.   Delay start of Pharmacological VTE agent (>24hrs) due to surgical blood loss or risk of bleeding: not applicable Details of procedure: Patient was taken the operating room prepped and draped for vaginal procedure with timeout conducted and procedure confirmed by operative team.  Ancef 2 g was administered preoperatively.   Speculum was inserted, cervix grasped with single-tooth tenaculum from its anteverted position we were able to place paracervical block at 3 and 5:00 as well as 7 and 9:00 a total of 20 cc followed by sounding of the uterus to 9 cm in the anteflexed position with dilation to 23 [place] allowing introduction of the 30 degree operative hysteroscope.  After initial visualization difficulties associated with the hysteroscope were corrected we were able to clearly visualize the endometrial cavity and there was a moderate sized approximately 1 cm polyp in the right cornual area.  Operative hysteroscope biopsy scissors were then used to amputate the polyp in 3 fragments which were  extracted and sent with to pathology.  Smooth sharp curettage was then performed.Marland Kitchen  Upon completion the specimen was sent to pathology as well.  At no time was any suspicion of complication. At this point the Minerva endometrial ablation device was prepared tested, inserted and activated at 4.5 cm length.  There was an adequate seal initially, the device was reset to 4 cm, and the balloon held at maximum insufflation, and on second effort the ablation sequence completed without difficulty..  There was minimal bleeding.  The ablation device had the appropriate char appearance to its outer surface.  Procedure was considered successfully completed, ultimate's final sponge count was normal.  Condition to recovery room stable

## 2019-10-23 NOTE — Anesthesia Procedure Notes (Signed)
Procedure Name: LMA Insertion Date/Time: 10/23/2019 7:35 AM Performed by: Elgie Congo, CRNA Pre-anesthesia Checklist: Patient identified, Emergency Drugs available, Suction available and Patient being monitored Patient Re-evaluated:Patient Re-evaluated prior to induction Oxygen Delivery Method: Circle system utilized Preoxygenation: Pre-oxygenation with 100% oxygen Induction Type: IV induction Ventilation: Mask ventilation without difficulty LMA: LMA inserted LMA Size: 4.0 Number of attempts: 1 Placement Confirmation: positive ETCO2 and breath sounds checked- equal and bilateral Tube secured with: Tape Dental Injury: Teeth and Oropharynx as per pre-operative assessment

## 2019-10-23 NOTE — Discharge Instructions (Signed)
Dilation and Curettage or Vacuum Curettage, Care After These instructions give you information about caring for yourself after your procedure. Your doctor may also give you more specific instructions. Call your doctor if you have any problems or questions after your procedure. Follow these instructions at home: Activity  Do not drive or use heavy machinery while taking prescription pain medicine.  For 24 hours after your procedure, avoid driving.  Take short walks often, followed by rest periods. Ask your doctor what activities are safe for you. After one or two days, you may be able to return to your normal activities.  Do not lift anything that is heavier than 10 lb (4.5 kg) until your doctor approves.  For at least 2 weeks, or as long as told by your doctor: ? Do not douche. ? Do not use tampons. ? Do not have sex. General instructions   Take over-the-counter and prescription medicines only as told by your doctor. This is very important if you take blood thinning medicine.  Do not take baths, swim, or use a hot tub until your doctor approves. Take showers instead of baths.  Wear compression stockings as told by your doctor.  It is up to you to get the results of your procedure. Ask your doctor when your results will be ready.  Keep all follow-up visits as told by your doctor. This is important. Contact a doctor if:  You have very bad cramps that get worse or do not get better with medicine.  You have very bad pain in your belly (abdomen).  You cannot drink fluids without throwing up (vomiting).  You get pain in a different part of the area between your belly and thighs (pelvis).  You have bad-smelling discharge from your vagina.  You have a rash. Get help right away if:  You are bleeding a lot from your vagina. A lot of bleeding means soaking more than one sanitary pad in an hour, for 2 hours in a row.  You have clumps of blood (blood clots) coming from your  vagina.  You have a fever or chills.  Your belly feels very tender or hard.  You have chest pain.  You have trouble breathing.  You cough up blood.  You feel dizzy.  You feel light-headed.  You pass out (faint).  You have pain in your neck or shoulder area. Summary  Take short walks often, followed by rest periods. Ask your doctor what activities are safe for you. After one or two days, you may be able to return to your normal activities.  Do not lift anything that is heavier than 10 lb (4.5 kg) until your doctor approves.  Do not take baths, swim, or use a hot tub until your doctor approves. Take showers instead of baths.  Contact your doctor if you have any symptoms of infection, like bad-smelling discharge from your vagina. This information is not intended to replace advice given to you by your health care provider. Make sure you discuss any questions you have with your health care provider. Document Revised: 02/25/2017 Document Reviewed: 12/01/2015 Elsevier Patient Education  2020 Elsevier Inc.  General Anesthesia, Adult, Care After This sheet gives you information about how to care for yourself after your procedure. Your health care provider may also give you more specific instructions. If you have problems or questions, contact your health care provider. What can I expect after the procedure? After the procedure, the following side effects are common:  Pain or discomfort at the   IV site.  Nausea.  Vomiting.  Sore throat.  Trouble concentrating.  Feeling cold or chills.  Weak or tired.  Sleepiness and fatigue.  Soreness and body aches. These side effects can affect parts of the body that were not involved in surgery. Follow these instructions at home:  For at least 24 hours after the procedure:  Have a responsible adult stay with you. It is important to have someone help care for you until you are awake and alert.  Rest as needed.  Do  not: ? Participate in activities in which you could fall or become injured. ? Drive. ? Use heavy machinery. ? Drink alcohol. ? Take sleeping pills or medicines that cause drowsiness. ? Make important decisions or sign legal documents. ? Take care of children on your own. Eating and drinking  Follow any instructions from your health care provider about eating or drinking restrictions.  When you feel hungry, start by eating small amounts of foods that are soft and easy to digest (bland), such as toast. Gradually return to your regular diet.  Drink enough fluid to keep your urine pale yellow.  If you vomit, rehydrate by drinking water, juice, or clear broth. General instructions  If you have sleep apnea, surgery and certain medicines can increase your risk for breathing problems. Follow instructions from your health care provider about wearing your sleep device: ? Anytime you are sleeping, including during daytime naps. ? While taking prescription pain medicines, sleeping medicines, or medicines that make you drowsy.  Return to your normal activities as told by your health care provider. Ask your health care provider what activities are safe for you.  Take over-the-counter and prescription medicines only as told by your health care provider.  If you smoke, do not smoke without supervision.  Keep all follow-up visits as told by your health care provider. This is important. Contact a health care provider if:  You have nausea or vomiting that does not get better with medicine.  You cannot eat or drink without vomiting.  You have pain that does not get better with medicine.  You are unable to pass urine.  You develop a skin rash.  You have a fever.  You have redness around your IV site that gets worse. Get help right away if:  You have difficulty breathing.  You have chest pain.  You have blood in your urine or stool, or you vomit blood. Summary  After the procedure, it  is common to have a sore throat or nausea. It is also common to feel tired.  Have a responsible adult stay with you for the first 24 hours after general anesthesia. It is important to have someone help care for you until you are awake and alert.  When you feel hungry, start by eating small amounts of foods that are soft and easy to digest (bland), such as toast. Gradually return to your regular diet.  Drink enough fluid to keep your urine pale yellow.  Return to your normal activities as told by your health care provider. Ask your health care provider what activities are safe for you. This information is not intended to replace advice given to you by your health care provider. Make sure you discuss any questions you have with your health care provider. Document Revised: 03/18/2017 Document Reviewed: 10/29/2016 Elsevier Patient Education  2020 Elsevier Inc.  

## 2019-10-23 NOTE — H&P (Signed)
Patient Shelly Hancock,femaleDOB:1990-05-30,28 y.o.OAC:[id number]   [facility] Visit  @DATE @ Patient name:Shelly Hancock [phone] Date of birth:01-16-1991  CC & HPI:  Shelly Thompsonis a 29 y.o.femalepresenting today to review the results of her transvaginal ultrasound on 10/04/2019 which was completed for AUB. She has taken Megace daily for the past month or so, but it is no longer working. The patient began heavily bleeding 3 days ago with large clots. She reports that the ultrasound was very painful. She sometimes has pain with intercourse.The patient denies fever, chills or any other symptoms or complaints at this time.   ROS:  ROS + AUB + occasional dyspareunia - fever - chills All systems are negative except as noted in the HPI and PMH.   Pertinent History Reviewed:  Reviewed: Medical     Past Medical History:  Diagnosis Date  . Eczema   . Headache(784.0)    Migraines  . Hypertension    with this pregnancy  . Postpartum depression   . Urinary tract infection   . Vaginal Pap smear, abnormal    Surgical Hx:      Past Surgical History:  Procedure Laterality Date  . LAPAROSCOPIC BILATERAL SALPINGECTOMY Bilateral 07/17/2019   Procedure: LAPAROSCOPIC BILATERAL SALPINGECTOMY; Surgeon: 07/19/2019, MD; Location: [facility]; Service: Gynecology; Laterality: Bilateral;  . NO PAST SURGERIES    . REMOVAL OF NON VAGINAL CONTRACEPTIVE DEVICE Left 07/17/2019   Procedure: REMOVAL OF NON VAGINAL CONTRACEPTIVE DEVICE, NEXPLANON; Surgeon: 07/19/2019, MD; Location: [facility]; Service: Gynecology; Laterality: Left;   Medications:Reviewed &Updated - see associated section  Current Outpatient Medications:  .Acetaminophen-Caff-Pyrilamine (MIDOL MAX ST MENSTRUAL) 500-60-15 MG TABS, Take 1-2 tablets by mouth 3 (three) times daily as  needed (Menstrual cramps). , Disp: , Rfl:  .buPROPion (WELLBUTRIN XL) 300 MG 24 hr tablet, Take by mouth., Disp: , Rfl:  .ELDERBERRY PO, Take 1 tablet by mouth daily. GUMMIES, Disp: , Rfl:  .megestrol (MEGACE) 40 MG tablet, Take 1 tablet (40 mg total) by mouth 3 (three) times daily., Disp: 45 tablet, Rfl: 2   Social History: Reviewed- reports that she has never smoked. She has never used smokeless tobacco.  Objective Findings:  Vitals:Blood pressure (!) 142/87, pulse 81, height 5\' 5"  (1.651 m), weight 259 lb 12.8 oz (117.8 kg).  PHYSICAL EXAMINATION General appearance -alert, well appearing, and in no distress, oriented to person, place, and time and overweight Mental status -alert, oriented to person, place, and time, normal mood, behavior, speech, dress, motor activity, and thought processes, affect appropriate to mood  PELVIC:   GYNECOLOGIC SONOGRAM   Shelly Thompsonis a 29 y.o.G2P2002 LMP is here for a pelvic sonogram for abnormal uterine bleeding .  Uterus 6.9 x 4.3 x 4.7 cm, Total uterine volume 74 cc,homogeneous anteverted uterus,wnl  Endometrium 7.5 mm, symmetrical, 1.7 x .6 x 1.5 cm heterogeneous echogenic mass within the endometrium, color stalk visualized (? Polyp)  Right ovary 2.3 x 1.9 x 1.6 cm, wnl  Left ovary 2.6 x 2.5 x 1.8 cm, wnl  No free fluid   Technician Comments:  PELVIC 30 TA/TV:homogeneous anteverted uterus,wnl,EEC 7.5 mm,1.7 x .6 x 1.5 cm heterogeneous echogenic mass within the endometrium, color stalk visualized (? Polyp),normal ovaries,ovaries appear mobile,pelvic pain during ultrasound,no free fluid   Chaperone Angie     Amber Annia Belt 10/04/2019 2:06 PM  Clinical Impression and recommendations:  I have reviewed the sonogram results above, combined with the patient's current clinical course, below are my impressions and any appropriate  recommendations for  management based on the sonographic findings.  Uterus is normal size shape and contour Endometrium normal thickness with small polyp noted Both ovaries are normal   Lazaro Arms 10/15/2019 7:17 AM   CBC    Component Value Date/Time   WBC 6.5 10/22/2019 0752   RBC 4.27 10/22/2019 0752   HGB 12.8 10/22/2019 0752   HGB 11.3 02/08/2017 1629   HCT 38.3 10/22/2019 0752   HCT 34.0 02/08/2017 1629   PLT 272 10/22/2019 0752   PLT 217 02/08/2017 1629   MCV 89.7 10/22/2019 0752   MCV 88 02/08/2017 1629   MCH 30.0 10/22/2019 0752   MCHC 33.4 10/22/2019 0752   RDW 12.3 10/22/2019 0752   RDW 13.9 02/08/2017 1629      CMP     Component Value Date/Time   NA 138 10/22/2019 0752   NA 138 02/08/2017 1629   K 3.6 10/22/2019 0752   CL 107 10/22/2019 0752   CO2 21 (L) 10/22/2019 0752   GLUCOSE 102 (H) 10/22/2019 0752   BUN 11 10/22/2019 0752   BUN 7 02/08/2017 1629   CREATININE 0.64 10/22/2019 0752   CALCIUM 9.1 10/22/2019 0752   PROT 7.4 10/22/2019 0752   PROT 6.1 02/08/2017 1629   ALBUMIN 4.1 10/22/2019 0752   ALBUMIN 3.6 02/08/2017 1629   AST 20 10/22/2019 0752   ALT 24 10/22/2019 0752   ALKPHOS 84 10/22/2019 0752   BILITOT 0.5 10/22/2019 0752   BILITOT <0.2 02/08/2017 1629   GFRNONAA >60 10/22/2019 0752   GFRAA >60 10/22/2019 0752      Assessment & Plan:   A: 1. AUB 2. Endometrial polyp -- reviewed ultrasound images with the patient  P: 1. hysteroscopy, D&C removal of endometrial polyp, and MinervaEndometrial ablation  Scheduled for 10/23/2019  By signing my name below, I, Pietro Cassis, attest that this documentation has been prepared under the direction and in the presence ofFerguson, Cyndi Lennert, MD. Electronically Signed: Pietro Cassis, Medical Scribe.10/10/19.2:04 PM.  I personally performed the services described in this documentation, which was SCRIBED in my presence. The recorded information has been reviewed and considered  accurate. It has been edited as necessary during review. [name], MD         Electronically signed by [name], MD at 10/21/2019 4:53 AM The patient has had thorough explanation of endometrial ablation, as a way of reducing or eliminating menstrual flow.  The procedure is considered irreversible, and further eliminates future pregnancy. Pt has already had a tubal ligation.

## 2019-10-23 NOTE — Anesthesia Postprocedure Evaluation (Signed)
Anesthesia Post Note  Patient: Laurren Lepkowski  Procedure(s) Performed: DILATATION AND CURETTAGE/HYSTEROSCOPY WITH MINERVA (N/A Uterus) POLYPECTOMY(Endometrial Polyp Removal) (N/A Uterus)  Patient location during evaluation: PACU Anesthesia Type: General Level of consciousness: awake and alert Pain management: pain level controlled Vital Signs Assessment: post-procedure vital signs reviewed and stable Respiratory status: spontaneous breathing, nonlabored ventilation and respiratory function stable Cardiovascular status: stable Postop Assessment: no apparent nausea or vomiting Anesthetic complications: no   No complications documented.   Last Vitals:  Vitals:   10/23/19 0915 10/23/19 0941  BP: 119/67 (!) 133/73  Pulse: 94 87  Resp: 21 20  Temp:  36.4 C  SpO2: 96% 98%    Last Pain:  Vitals:   10/23/19 0943  TempSrc:   PainSc: 10-Worst pain ever                 [employer]

## 2019-10-23 NOTE — Anesthesia Preprocedure Evaluation (Addendum)
Anesthesia Evaluation  Patient identified by MRN, date of birth, ID band Patient awake    Reviewed: Allergy & Precautions, NPO status , Patient's Chart, lab work & pertinent test results  History of Anesthesia Complications Negative for: history of anesthetic complications  Airway Mallampati: II  TM Distance: >3 FB Neck ROM: Full    Dental  (+) Dental Advisory Given, Missing   Pulmonary    Pulmonary exam normal breath sounds clear to auscultation       Cardiovascular hypertension, Normal cardiovascular exam Rhythm:Regular Rate:Normal     Neuro/Psych  Headaches, PSYCHIATRIC DISORDERS Depression    GI/Hepatic negative GI ROS, Neg liver ROS,   Endo/Other  Morbid obesity  Renal/GU negative Renal ROS     Musculoskeletal   Abdominal   Peds  Hematology negative hematology ROS (+)   Anesthesia Other Findings   Reproductive/Obstetrics                            Anesthesia Physical Anesthesia Plan  ASA: II  Anesthesia Plan: General   Post-op Pain Management:    Induction: Intravenous  PONV Risk Score and Plan: 4 or greater and Ondansetron, Dexamethasone and Midazolam  Airway Management Planned: LMA  Additional Equipment:   Intra-op Plan:   Post-operative Plan: Extubation in OR  Informed Consent: I have reviewed the patients History and Physical, chart, labs and discussed the procedure including the risks, benefits and alternatives for the proposed anesthesia with the patient or authorized representative who has indicated his/her understanding and acceptance.     Dental advisory given  Plan Discussed with: CRNA and Surgeon  Anesthesia Plan Comments:       Anesthesia Quick Evaluation

## 2019-10-23 NOTE — Transfer of Care (Signed)
Immediate Anesthesia Transfer of Care Note  Patient: Shelly Hancock  Procedure(s) Performed: DILATATION AND CURETTAGE/HYSTEROSCOPY WITH MINERVA (N/A Uterus) POLYPECTOMY(Endometrial Polyp Removal) (N/A Uterus)  Patient Location: PACU  Anesthesia Type:General  Level of Consciousness: awake, alert  and oriented  Airway & Oxygen Therapy: Patient Spontanous Breathing  Post-op Assessment: Report given to RN and Post -op Vital signs reviewed and stable  Post vital signs: Reviewed and stable  Last Vitals:  Vitals Value Taken Time  BP 141/77 10/23/19 0835  Temp    Pulse 117 10/23/19 0842  Resp 16 10/23/19 0842  SpO2 98 % 10/23/19 0842  Vitals shown include unvalidated device data.  Last Pain:  Vitals:   10/23/19 0650  TempSrc: Oral      Patients Stated Pain Goal: 7 (10/23/19 2957)  Complications: No complications documented.

## 2019-10-23 NOTE — Hospital Course (Signed)
10/23/2019  8:44 AM  PATIENT:  Shelly Hancock  29 y.o. female  PRE-OPERATIVE DIAGNOSIS:  Menorrhagia Endometrial Polyp  POST-OPERATIVE DIAGNOSIS:  Menorrhagia Endometrial Polyp  PROCEDURE:  Procedure(s): DILATATION AND CURETTAGE/HYSTEROSCOPY WITH MINERVA (N/A) POLYPECTOMY(Endometrial Polyp Removal) (N/A)  SURGEON:  Surgeon(s) and Role:    Jonnie Kind, MD - Primary  PHYSICIAN ASSISTANT:   ASSISTANTS: none   ANESTHESIA:   general, paracervical block and MAC  EBL:  3 mL   BLOOD ADMINISTERED:none  DRAINS: none   LOCAL MEDICATIONS USED:  MARCAINE    and Amount: 20 ml  SPECIMEN:  Source of Specimen:  1 endometrial polyp 2 endometrial curettings  DISPOSITION OF SPECIMEN:  PATHOLOGY  COUNTS:  YES, correct  TOURNIQUET:  * No tourniquets in log *  DICTATION: .Dragon Dictation  PLAN OF CARE: Discharge to home after PACU  PATIENT DISPOSITION:  PACU - hemodynamically stable.   Delay start of Pharmacological VTE agent (>24hrs) due to surgical blood loss or risk of bleeding: not applicable Details of procedure: Patient was taken the operating room prepped and draped for vaginal procedure with timeout conducted and procedure confirmed by operative team.  Ancef 2 g was administered preoperatively.   Speculum was inserted, cervix grasped with single-tooth tenaculum from its anteverted position we were able to place paracervical block at 3 and 5:00 as well as 7 and 9:00 a total of 20 cc followed by sounding of the uterus to 9 cm in the anteflexed position with dilation to 23 [place] allowing introduction of the 30 degree operative hysteroscope.  After initial visualization difficulties associated with the hysteroscope were corrected we were able to clearly visualize the endometrial cavity and there was a moderate sized approximately 1 cm polyp in the right cornual area.  Operative hysteroscope biopsy scissors were then used to amputate the polyp in 3 fragments which were  extracted and sent with to pathology.  Smooth sharp curettage was then performed.Marland Kitchen  Upon completion the specimen was sent to pathology as well.  At no time was any suspicion of complication. At this point the Minerva endometrial ablation device was prepared tested, inserted and activated at 4.5 cm length.  There was an adequate seal initially, the device was reset to 4 cm, and the balloon held at maximum insufflation, and on second effort the ablation sequence completed without difficulty..  There was minimal bleeding.  The ablation device had the appropriate char appearance to its outer surface.  Procedure was considered successfully completed, ultimate's final sponge count was normal.  Condition to recovery room stable

## 2019-10-24 ENCOUNTER — Encounter (HOSPITAL_COMMUNITY): Payer: Self-pay | Admitting: Obstetrics and Gynecology

## 2019-10-24 LAB — SURGICAL PATHOLOGY

## 2019-10-26 NOTE — Progress Notes (Signed)
Benign endometrial polyp, benign endometrial curettings.

## 2019-11-06 ENCOUNTER — Encounter: Payer: Self-pay | Admitting: Obstetrics and Gynecology

## 2019-11-06 ENCOUNTER — Ambulatory Visit (INDEPENDENT_AMBULATORY_CARE_PROVIDER_SITE_OTHER): Payer: Medicaid Other | Admitting: Obstetrics and Gynecology

## 2019-11-06 VITALS — BP 133/89 | HR 78 | Ht 65.0 in | Wt 255.0 lb

## 2019-11-06 DIAGNOSIS — Z09 Encounter for follow-up examination after completed treatment for conditions other than malignant neoplasm: Secondary | ICD-10-CM

## 2019-11-06 DIAGNOSIS — Z9889 Other specified postprocedural states: Secondary | ICD-10-CM | POA: Insufficient documentation

## 2019-11-06 DIAGNOSIS — N920 Excessive and frequent menstruation with regular cycle: Secondary | ICD-10-CM

## 2019-11-06 NOTE — Progress Notes (Signed)
PATIENT ID: Shelly Hancock, female     DOB: 23-Dec-1990, 29 y.o.     MRN: [id number]   Subjective:  Shelly Hancock is a 29 y.o. female now 2 weeks status post operative hysteroscopy and polypectomy and nexplanon removal.   She has no major complaints. She has had no bleeding since the day of the surgery.   Review of Systems Negative   Diet:   Normal   Bowel movements : normal.  The patient is not having any pain.  Objective:  LMP 10/22/2019  General:Well developed, well nourished.  No acute distress. Abdomen: Bowel sounds normal, soft, non-tender.  Incision(s): N/A       Assessment:  Post-Op 2 weeks s/p operative hysteroscopy and polypectomy and nexplanon removal   Excellent course postoperatively.   Plan:  1. Wound care discussed  healed 2. Current medications.n/qa 3. Activity restrictions: none 4. Return to work: not applicable. 5. Follow up in prn .   By signing my name below, I, [employer], attest that this documentation has been prepared under the direction and in the presence of [name], MD. Electronically Signed: Mal Misty Medical Scribe. 11/06/19. 8:58 AM.  I personally performed the services described in this documentation, which was SCRIBED in my presence. The recorded information has been reviewed and considered accurate. It has been edited as necessary during review. [name], MD

## 2020-01-25 ENCOUNTER — Encounter (HOSPITAL_COMMUNITY): Payer: Self-pay

## 2020-01-25 ENCOUNTER — Encounter (HOSPITAL_COMMUNITY): Payer: Medicaid Other

## 2020-08-20 ENCOUNTER — Emergency Department (HOSPITAL_COMMUNITY)
Admission: EM | Admit: 2020-08-20 | Discharge: 2020-08-20 | Discharge status: Against Medical Advice | Payer: 59 | Attending: Emergency Medicine | Admitting: Emergency Medicine

## 2020-08-20 ENCOUNTER — Other Ambulatory Visit: Payer: Self-pay

## 2020-10-21 ENCOUNTER — Ambulatory Visit: Payer: 59 | Admitting: Adult Health

## 2021-06-24 ENCOUNTER — Encounter: Payer: Self-pay | Admitting: Adult Health

## 2021-06-24 ENCOUNTER — Ambulatory Visit (INDEPENDENT_AMBULATORY_CARE_PROVIDER_SITE_OTHER): Payer: 59 | Admitting: Adult Health

## 2021-06-24 ENCOUNTER — Other Ambulatory Visit (HOSPITAL_COMMUNITY)
Admission: RE | Admit: 2021-06-24 | Discharge: 2021-06-24 | Disposition: A | Discharge status: Home / Self Care | Payer: 59 | Source: Ambulatory Visit | Attending: Adult Health | Admitting: Adult Health

## 2021-06-24 VITALS — BP 140/90 | HR 84 | Ht 64.0 in | Wt 263.0 lb

## 2021-06-24 DIAGNOSIS — Z124 Encounter for screening for malignant neoplasm of cervix: Secondary | ICD-10-CM | POA: Diagnosis present

## 2021-06-24 DIAGNOSIS — N941 Unspecified dyspareunia: Secondary | ICD-10-CM | POA: Diagnosis not present

## 2021-06-24 DIAGNOSIS — R102 Pelvic and perineal pain: Secondary | ICD-10-CM | POA: Diagnosis not present

## 2021-06-24 NOTE — Progress Notes (Signed)
?  Subjective:  ?  ? Patient ID: Shelly Hancock, female   DOB: 1991/01/17, 31 y.o.   MRN: [id number] ? ?HPI ?Shelly Hancock is a 31 year old white female, single, G2P2, she is sp tubal and ablation, in complaining of cramping and pain with sex since about October. ?She needs pap. ?PCP is A Marcell Anger PA. ? ?Review of Systems ?Since October has had cramping and pain with sex ?Denies any bleeding or vaginal discharge ?Reviewed past medical,surgical, social and family history. Reviewed medications and allergies.  ?   ?Objective:  ? Physical Exam ?BP 140/90 (BP Location: Left Arm, Patient Position: Sitting, Cuff Size: Large)   Pulse 84   Ht 5\' 4"  (1.626 m)   Wt 263 lb (119.3 kg)   BMI 45.14 kg/m?   ?  Skin warm and dry.Pelvic: external genitalia is normal in appearance no lesions, vagina: white discharge with odor,urethra has no lesions or masses noted, cervix:smooth and bulbous,pap with GC/CHL and HR HPV genotyping performed,no CMT,  uterus: normal size, shape and contour, mildly tender, no masses felt, adnexa: no masses, LLQ tenderness noted. Bladder is non tender and no masses felt.  ?Fall risk is low ? Upstream - 06/24/21 1511   ? ?  ? Pregnancy Intention Screening  ? Does the patient want to become pregnant in the next year? No   ? Does the patient's partner want to become pregnant in the next year? No   ? Would the patient like to discuss contraceptive options today? No   ?  ? Contraception Wrap Up  ? Current Method Female Sterilization   ? End Method Female Sterilization   ? Contraception Counseling Provided No   ? ?  ?  ? ?  ? Examination chaperoned by Balinda Quails NP student. ? ?Assessment:  ?   ?1. Routine cervical smear ?Pap sent ?Pap in 3 years if normal ?- Cytology - PAP( [place]) ? ?2. Pelvic cramping ?Has had since October, feels like when had polyp before ablation ?Will get [place] 06/26/21 at 1:30 pm at [facility] to assess uterus and ovaries ?- US PELVIC COMPLETE WITH TRANSVAGINAL; Future ? ?3. Dyspareunia,  female ?- US PELVIC COMPLETE WITH TRANSVAGINAL; Future  ?   ?Plan:  ?   ?Follow up TBD after [place] results back  ?   ?

## 2021-06-26 ENCOUNTER — Ambulatory Visit (HOSPITAL_COMMUNITY)
Admission: RE | Admit: 2021-06-26 | Discharge: 2021-06-26 | Disposition: A | Discharge status: Home / Self Care | Payer: 59 | Source: Ambulatory Visit | Attending: Adult Health | Admitting: Adult Health

## 2021-06-26 DIAGNOSIS — N941 Unspecified dyspareunia: Secondary | ICD-10-CM | POA: Insufficient documentation

## 2021-06-26 DIAGNOSIS — R102 Pelvic and perineal pain: Secondary | ICD-10-CM | POA: Insufficient documentation

## 2021-06-26 LAB — CYTOLOGY - PAP
Chlamydia: NEGATIVE
Comment: NEGATIVE
Comment: NEGATIVE
Comment: NORMAL
Diagnosis: NEGATIVE
High risk HPV: NEGATIVE
Neisseria Gonorrhea: NEGATIVE

## 2021-06-29 ENCOUNTER — Other Ambulatory Visit: Payer: Self-pay | Admitting: Adult Health

## 2021-06-29 MED ORDER — DOXYCYCLINE HYCLATE 100 MG PO TABS: 100.0000 mg | ORAL_TABLET | Freq: Two times a day (BID) | ORAL | 0 refills | Status: DC

## 2021-06-29 NOTE — Progress Notes (Signed)
Will rx doxycycline, had fluid in uterus on [place] ? ?

## 2021-07-01 ENCOUNTER — Ambulatory Visit (INDEPENDENT_AMBULATORY_CARE_PROVIDER_SITE_OTHER): Payer: 59 | Admitting: Adult Health

## 2021-07-01 ENCOUNTER — Encounter: Payer: Self-pay | Admitting: Adult Health

## 2021-07-01 VITALS — BP 140/87 | HR 77 | Ht 65.0 in | Wt 263.0 lb

## 2021-07-01 DIAGNOSIS — Z9889 Other specified postprocedural states: Secondary | ICD-10-CM

## 2021-07-01 DIAGNOSIS — R1032 Left lower quadrant pain: Secondary | ICD-10-CM

## 2021-07-01 DIAGNOSIS — N941 Unspecified dyspareunia: Secondary | ICD-10-CM | POA: Diagnosis not present

## 2021-07-01 DIAGNOSIS — R102 Pelvic and perineal pain: Secondary | ICD-10-CM | POA: Diagnosis not present

## 2021-07-01 MED ORDER — AMOXICILLIN-POT CLAVULANATE 875-125 MG PO TABS: 1.0000 | ORAL_TABLET | Freq: Two times a day (BID) | ORAL | 0 refills | Status: DC

## 2021-07-01 NOTE — Progress Notes (Signed)
?  Subjective:  ?  ? Patient ID: Shelly Hancock, female   DOB: [birth year], 31 y.o.   MRN: [id number] ? ?HPI ?Shelly Hancock is a 31 year old white female, single, G2P2 back in follow up on having cramping and LLQ pain that comes and goes, Had [place] that showed fluid in endometrium, was prescribed doxycycline to try to clear fluid but she had reaction of hives and nausea and vomiting, so she stopped it. ?Lab Results  ?Component Value Date  ? DIAGPAP  06/24/2021  ?  - Negative for intraepithelial lesion or malignancy (NILM)  ? HPVHIGH Negative 06/24/2021  ? PCP is A Barth Kirks PA. ? ?Review of Systems ?Has cramping ?Pain in left side that goes and comes ?Has had pain with sex ?Reviewed past medical,surgical, social and family history. Reviewed medications and allergies.  ?   ?Objective:  ? Physical Exam ?BP 140/87 (BP Location: Left Arm, Patient Position: Sitting, Cuff Size: Large)   Pulse 77   Ht 5\' 5"  (1.651 m)   Wt 263 lb (119.3 kg)   BMI 43.77 kg/m?   ?  Talk only: ? reviewed again:IMPRESSION: ?There is small amount of fluid in the endometrial cavity. There are ?new complex cystic structures in the central portion of lower ?uterine segment. These may suggest endometrial cysts or residual ?changes from previous endometrial ablation. ?  ?There are no adnexal masses. There is vascular flow in both adnexal ?regions. There is no free fluid in the pelvis. ? fall risk is low ? Upstream - 07/01/21 1013   ? ?  ? Pregnancy Intention Screening  ? Does the patient want to become pregnant in the next year? No   ? Does the patient's partner want to become pregnant in the next year? No   ? Would the patient like to discuss contraceptive options today? No   ?  ? Contraception Wrap Up  ? Current Method Female Sterilization   ? End Method Female Sterilization   ? Contraception Counseling Provided No   ? ?  ?  ? ?  ?  ?Assessment:  ?   ?1. Pelvic cramping ?Will rx Augmentin,since had reaction to doxycycline ?Meds ordered this encounter   ?Medications  ? amoxicillin-clavulanate (AUGMENTIN) 875-125 MG tablet  ?  Sig: Take 1 tablet by mouth 2 (two) times daily.  ?  Dispense:  20 tablet  ?  Refill:  0  ?  Order Specific Question:   Supervising Provider  ?  Answer:   08/31/21 H [[id number]]  ? Try 2 ES tylenol with 3  200 mg ibuprofen every 6-8 hours prn ?Heating pad prn  ? ?2. LLQ pain ?Ovaries normal on Duane Lope ? ?3. Status post hysteroscopic ablation of endometrium ?Has fluid in endometrial cavity, per Korea  ? ?4. Dyspareunia, female ? ?   ?Plan:  ?   ?Follow up with Dr Korea 07/17/21 to discuss other options  ?   ?

## 2021-07-16 ENCOUNTER — Encounter: Payer: Self-pay | Admitting: Obstetrics & Gynecology

## 2021-07-16 ENCOUNTER — Encounter: Payer: Self-pay | Admitting: *Deleted

## 2021-07-16 ENCOUNTER — Ambulatory Visit (INDEPENDENT_AMBULATORY_CARE_PROVIDER_SITE_OTHER): Payer: 59 | Admitting: Obstetrics & Gynecology

## 2021-07-16 VITALS — BP 159/93 | HR 69 | Ht 65.0 in | Wt 264.8 lb

## 2021-07-16 DIAGNOSIS — R102 Pelvic and perineal pain unspecified side: Secondary | ICD-10-CM

## 2021-07-16 DIAGNOSIS — Z9889 Other specified postprocedural states: Secondary | ICD-10-CM | POA: Diagnosis not present

## 2021-07-16 DIAGNOSIS — N941 Unspecified dyspareunia: Secondary | ICD-10-CM

## 2021-07-16 MED ORDER — ORILISSA 150 MG PO TABS: 150.0000 mg | ORAL_TABLET | Freq: Every day | ORAL | 11 refills | Status: AC

## 2021-07-16 MED ORDER — ORILISSA 150 MG PO TABS: 150.0000 mg | ORAL_TABLET | Freq: Every day | ORAL | 11 refills | Status: DC

## 2021-07-16 NOTE — Addendum Note (Signed)
Addended by: Sharon Seller on: 07/16/2021 10:05 AM ? ? Modules accepted: Orders ? ?

## 2021-07-16 NOTE — Progress Notes (Signed)
? ?GYN VISIT ?Patient name: Shelly Hancock MRN [id number]  Date of birth: 1991/02/14 ?Chief Complaint:   ?Follow-up (On LLQ pain) ? ?History of Present Illness:   ?Shelly Hancock is a 31 y.o. [phone] female being seen today for follow up regarding: ? ?-Pelvic pain: seen by Roseanne Reno on 07/01/21 due to pelvic pain.  This has been an ongoing issue since October.  Symptoms come/go.  Occasional dyspareunia and has gotten progressively worse.  Treated with Doxy due to fluid within uterus.  Today she notes that the pain has continued, low abdomen, mostly on the left side.  Notes minimal improvement with antibiotic and now have continued pelvic pressure.  Taking ibuprofen 800mg  regularly and tylenol with minimal improvement. ? ? reviewed- 7.4cm uterus with cystic structures w/n endometrium.  Normal ovaries bilaterally. ? ?Prior tubal and endometrial ablation.  No bleeding since ablation.  No nausea/vomiting.  No urinary symptoms.  No change in BMs.  Pt tearful in discussing as she states she is sick of feeling pain as it is influencing quality of life and work. ? ?No LMP recorded. Patient has had an ablation. ? ? ?  03/15/2017  ? 11:16 AM 09/22/2016  ?  3:50 PM  ?Depression screen PHQ 2/9  ?Decreased Interest 1 0  ?Down, Depressed, Hopeless 0 0  ?PHQ - 2 Score 1 0  ?Altered sleeping 0 0  ?Tired, decreased energy 1 0  ?Change in appetite 1 0  ?Feeling bad or failure about yourself  2 0  ?Trouble concentrating 0 0  ?Moving slowly or fidgety/restless 0 0  ?Suicidal thoughts 0 0  ?PHQ-9 Score 5 0  ? ? ? ?Review of Systems:   ?Pertinent items are noted in HPI ?Denies fever/chills, dizziness, headaches, visual disturbances, fatigue, shortness of breath, chest pain, abdominal pain, vomiting, no problems with periods, bowel movements, urination unless otherwise stated above.  ?Pertinent History Reviewed:  ?Reviewed past medical,surgical, social, obstetrical and family history.  ?Reviewed problem list, medications and  allergies. ?Physical Assessment:  ? ?Vitals:  ? 07/16/21 0826  ?BP: (!) 159/93  ?Pulse: 69  ?Weight: 264 lb 12.8 oz (120.1 kg)  ?Height: 5\' 5"  (1.651 m)  ?Body mass index is 44.07 kg/m?. ? ?     Physical Examination:  ? General appearance: alert, well appearing, and in no distress ? Psych: mood appropriate, normal affect ? Skin: warm & dry  ? Cardiovascular: normal heart rate noted ? Respiratory: normal respiratory effort, no distress ? Abdomen: soft, non-tender  ? Pelvic: VULVA: normal appearing vulva with no masses, tenderness or lesions, VAGINA: normal appearing vagina with normal color and discharge, no lesions, CERVIX: normal appearing cervix without discharge or lesions, UTERUS: uterus is normal size, shape, consistency diffuse tenderness during exam.  Bimanual exam limited due to body habitus- no masses appreciate, tenderness noted- mostly midline ? Extremities: no edema, no calf tenderness bilaterally ? ?Chaperone: 07/18/21   ? 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 ?Meds ordered this encounter  ?Medications  ?  Elagolix Sodium (ORILISSA) 150 MG TABS  ?  Sig: Take 150 mg by mouth daily.  ?  Dispense:  30 tablet  ?  Refill:  11  ? ? ? ? ?Return in about 3 months (around 10/15/2021) for Medication follow up (Alexei Doswell or Eure). ? ? ?Myna Hidalgo, DO ?Attending Obstetrician & Gynecologist, Faculty  Practice ?[facility] for [employer], [facility] ? ? ? ?

## 2021-07-17 ENCOUNTER — Ambulatory Visit: Payer: 59 | Admitting: Obstetrics & Gynecology

## 2021-07-17 ENCOUNTER — Telehealth: Payer: Self-pay | Admitting: *Deleted

## 2021-07-17 NOTE — Telephone Encounter (Signed)
Pt's insurance has approved Orilissa 150 mg through 01/15/22. Pharmacy aware and I left pt a message @ 1:50 pm letting her know. JSY ?

## 2021-07-29 ENCOUNTER — Ambulatory Visit: Payer: Medicaid Other | Admitting: Nurse Practitioner

## 2021-08-01 ENCOUNTER — Encounter: Payer: Self-pay | Admitting: Obstetrics & Gynecology

## 2021-08-03 ENCOUNTER — Other Ambulatory Visit: Payer: Self-pay | Admitting: Obstetrics & Gynecology

## 2021-08-03 ENCOUNTER — Telehealth: Payer: Self-pay

## 2021-08-03 MED ORDER — KETOROLAC TROMETHAMINE 10 MG PO TABS: 10.0000 mg | ORAL_TABLET | Freq: Three times a day (TID) | ORAL | 0 refills | Status: DC | PRN

## 2021-08-03 MED ORDER — MEGESTROL ACETATE 40 MG PO TABS: ORAL_TABLET | ORAL | 3 refills | Status: DC

## 2021-08-03 NOTE — Telephone Encounter (Signed)
PT CALLED AND WANTS A NURSE TO RESPOND TO HER MYCHART MESSAGE. ?

## 2021-08-25 ENCOUNTER — Ambulatory Visit (INDEPENDENT_AMBULATORY_CARE_PROVIDER_SITE_OTHER): Payer: 59 | Admitting: Obstetrics & Gynecology

## 2021-08-25 ENCOUNTER — Encounter: Payer: Self-pay | Admitting: Obstetrics & Gynecology

## 2021-08-25 VITALS — BP 124/79 | HR 85 | Ht 65.0 in | Wt 256.0 lb

## 2021-08-25 DIAGNOSIS — R102 Pelvic and perineal pain: Secondary | ICD-10-CM | POA: Diagnosis not present

## 2021-08-25 DIAGNOSIS — N941 Unspecified dyspareunia: Secondary | ICD-10-CM

## 2021-08-25 DIAGNOSIS — Z9889 Other specified postprocedural states: Secondary | ICD-10-CM

## 2021-08-25 NOTE — Progress Notes (Signed)
Chief Complaint  Patient presents with   discuss having surgery      31 y.o. [id number] No LMP recorded. Patient has had an ablation. The current method of family planning is tubal ligation.  Outpatient Encounter Medications as of 08/25/2021  Medication Sig   Acetaminophen (TYLENOL PO) Take by mouth.   buPROPion (WELLBUTRIN SR) 150 MG 12 hr tablet Take by mouth.   clonazePAM (KLONOPIN) 0.5 MG tablet Take by mouth.   ketorolac (TORADOL) 10 MG tablet Take 1 tablet (10 mg total) by mouth every 8 (eight) hours as needed. (Patient not taking: Reported on 08/25/2021)   megestrol (MEGACE) 40 MG tablet 3 tablets a day for 5 days, 2 tablets a day for 5 days then 1 tablet daily   olmesartan (BENICAR) 20 MG tablet Take 20 mg by mouth daily.   No facility-administered encounter medications on file as of 08/25/2021.    Subjective Pt states her bleedin gis nearly resolved on the megestrol Her dyspareunia is unchanged Her daily pain is somewhat improved Overall improved but still with pressure discomfort Past Medical History:  Diagnosis Date   Eczema    Headache(784.0)    Migraines   Hypertension    with this pregnancy   Postpartum depression    Urinary tract infection    Vaginal Pap smear, abnormal     Past Surgical History:  Procedure Laterality Date   DILATATION AND CURETTAGE/HYSTEROSCOPY WITH MINERVA N/A 10/23/2019   Procedure: DILATATION AND CURETTAGE/HYSTEROSCOPY WITH MINERVA;  Surgeon: [name], MD;  Location: [facility];  Service: Gynecology;  Laterality: N/A;   LAPAROSCOPIC BILATERAL SALPINGECTOMY Bilateral 07/17/2019   Procedure: LAPAROSCOPIC BILATERAL SALPINGECTOMY;  Surgeon: [name], MD;  Location: [facility];  Service: Gynecology;  Laterality: Bilateral;   NO PAST SURGERIES     POLYPECTOMY N/A 10/23/2019   Procedure: POLYPECTOMY(Endometrial Polyp Removal);  Surgeon: [name], MD;  Location: [facility];  Service: Gynecology;  Laterality: N/A;   REMOVAL OF NON  VAGINAL CONTRACEPTIVE DEVICE Left 07/17/2019   Procedure: REMOVAL OF NON VAGINAL CONTRACEPTIVE DEVICE, NEXPLANON;  Surgeon: [name], MD;  Location: [facility];  Service: Gynecology;  Laterality: Left;    OB History     Gravida  2   Para  2   Term  2   Preterm  0   AB  0   Living  2      SAB  0   IAB  0   Ectopic  0   Multiple  0   Live Births  2           Allergies  Allergen Reactions   Doxycycline Hives and Nausea And Vomiting    Social History   Socioeconomic History   Marital status: Single    Spouse name: Not on file   Number of children: Not on file   Years of education: Not on file   Highest education level: Not on file  Occupational History   Not on file  Tobacco Use   Smoking status: Never   Smokeless tobacco: Never  Vaping Use   Vaping Use: Never used  Substance and Sexual Activity   Alcohol use: Yes    Comment: monthly   Drug use: No   Sexual activity: Yes    Birth control/protection: Surgical    Comment: tubal & ablation  Other Topics Concern   Not on file  Social History Narrative   Not on file   Social Determinants of  Health   Financial Resource Strain: Not on file  Food Insecurity: Not on file  Transportation Needs: Not on file  Physical Activity: Not on file  Stress: Not on file  Social Connections: Not on file    Family History  Problem Relation Age of Onset   Diabetes Paternal Grandmother    Heart disease Paternal Grandmother    Cancer Maternal Grandmother        colon   Heart attack Father    Hypertension Mother    Irritable bowel syndrome Son    Crohn's disease Son    Cancer Maternal Aunt        skin   Cancer Maternal Uncle        lung cancer   Other Neg Hx     Medications:       Current Outpatient Medications:    Acetaminophen (TYLENOL PO), Take by mouth., Disp: , Rfl:    buPROPion (WELLBUTRIN SR) 150 MG 12 hr tablet, Take by mouth., Disp: , Rfl:    clonazePAM (KLONOPIN) 0.5 MG tablet, Take by  mouth., Disp: , Rfl:    ketorolac (TORADOL) 10 MG tablet, Take 1 tablet (10 mg total) by mouth every 8 (eight) hours as needed. (Patient not taking: Reported on 08/25/2021), Disp: 15 tablet, Rfl: 0   megestrol (MEGACE) 40 MG tablet, 3 tablets a day for 5 days, 2 tablets a day for 5 days then 1 tablet daily, Disp: 45 tablet, Rfl: 3   olmesartan (BENICAR) 20 MG tablet, Take 20 mg by mouth daily., Disp: , Rfl:   Objective Blood pressure 124/79, pulse 85, height 5\' 5"  (1.651 m), weight 256 lb (116.1 kg).  Gen WDWN NAD  Pertinent ROS No burning with urination, frequency or urgency No nausea, vomiting or diarrhea Nor fever chills or other constitutional symptoms   Labs or studies PELVIC COMPLETE WITH TRANSVAGINAL  Addendum Date: 08/21/2021   ADDENDUM REPORT: 08/21/2021 12:50 ADDENDUM: This addendum is made to clarify Doppler technique used for the study. Color flow Doppler examination was done. Pulsed Doppler examination was not performed. Electronically Signed   By: 08/23/2021 M.D.   On: 08/21/2021 12:50   Result Date: 08/21/2021 CLINICAL DATA:  Pelvic pain EXAM: TRANSABDOMINAL AND TRANSVAGINAL ULTRASOUND OF PELVIS DOPPLER ULTRASOUND OF OVARIES TECHNIQUE: Both transabdominal and transvaginal ultrasound examinations of the pelvis were performed. Transabdominal technique was performed for global imaging of the pelvis including uterus, ovaries, adnexal regions, and pelvic cul-de-sac. It was necessary to proceed with endovaginal exam following the transabdominal exam to visualize the endometrium and ovaries. Color and duplex Doppler ultrasound was utilized to evaluate blood flow to the ovaries. COMPARISON:  10/04/2019 FINDINGS: Uterus Measurements: 7.4 x 3.5 x 5 cm = volume: 67.6 mL. There is inhomogeneous echogenicity in myometrium. Endometrium Thickness: 5 mm in the fundus. There is fluid in the endometrial cavity. There are few loculated cystic structures in the lower uterine segment each  measuring less than 1.5 cm this finding was not evident in the previous examination. Cervix is closed. Right ovary Measurements: 3 x 2.4 x 2.3 cm = volume: 8.5 mL. Normal appearance/no adnexal mass. Left ovary Measurements: 2.4 x 2.3 x 2.4 cm = volume: 6.9 mL. Normal appearance/no adnexal mass. Pulsed Doppler evaluation of both ovaries demonstrates normal low-resistance arterial and venous waveforms. Other findings No abnormal free fluid. IMPRESSION: There is small amount of fluid in the endometrial cavity. There are new complex cystic structures in the central portion of lower uterine segment. These may  suggest endometrial cysts or residual changes from previous endometrial ablation. There are no adnexal masses. There is vascular flow in both adnexal regions. There is no free fluid in the pelvis. Electronically Signed: By: Ernie AvenaPalani  Rathinasamy M.D. On: 06/26/2021 14:16       Impression + Management Plan: Diagnoses this Encounter::   ICD-10-CM   1. Dyspareunia, female  N94.10     2. Pelvic pain  R10.2     3. S/P endometrial ablation  Z98.890       Continue megestrol Re evlauate in 60 days(90 days total)  Medications prescribed during  this encounter: No orders of the defined types were placed in this encounter.   Labs or Scans Ordered during this encounter: No orders of the defined types were placed in this encounter.     Follow up Return in about 2 months (around 10/25/2021) for Follow up, with Dr Despina HiddenEure.

## 2021-10-15 ENCOUNTER — Ambulatory Visit: Payer: 59 | Admitting: Obstetrics & Gynecology

## 2021-10-20 ENCOUNTER — Telehealth: Payer: 59 | Admitting: Family Medicine

## 2021-10-20 DIAGNOSIS — R630 Anorexia: Secondary | ICD-10-CM

## 2021-10-20 DIAGNOSIS — R5383 Other fatigue: Secondary | ICD-10-CM

## 2021-10-20 DIAGNOSIS — M5441 Lumbago with sciatica, right side: Secondary | ICD-10-CM

## 2021-10-20 NOTE — Progress Notes (Signed)
Shelly Hancock   Due to several concerns listed in the EV- it is better if she is seen in person to help identify the cause or multi causes of symptoms.   Message sent.

## 2021-10-26 ENCOUNTER — Ambulatory Visit (INDEPENDENT_AMBULATORY_CARE_PROVIDER_SITE_OTHER): Payer: 59 | Admitting: Obstetrics & Gynecology

## 2021-10-26 ENCOUNTER — Encounter: Payer: Self-pay | Admitting: Obstetrics & Gynecology

## 2021-10-26 VITALS — BP 105/68 | HR 90 | Ht 65.0 in | Wt 252.6 lb

## 2021-10-26 DIAGNOSIS — R102 Pelvic and perineal pain: Secondary | ICD-10-CM | POA: Diagnosis not present

## 2021-10-26 DIAGNOSIS — N941 Unspecified dyspareunia: Secondary | ICD-10-CM | POA: Diagnosis not present

## 2021-10-26 NOTE — Progress Notes (Signed)
Follow up appointment for results: Response to megestrol  Chief Complaint  Patient presents with   Follow-up    Blood pressure 105/68, pulse 90, height 5\' 5"  (1.651 m), weight 252 lb 9.6 oz (114.6 kg).    Note from last visit: Pt states her bleedin gis nearly resolved on the megestrol Her dyspareunia is unchanged Her daily pain is somewhat improved Overall improved but still with pressure discomfort  Patient states that since her last visit 2 months ago she has had 1 episode of relatively light bleeding which was this past week Next couple days pain also associated She has discomfort and pain about every 2 weeks or so This was the only bleeding episode in the last 2 months She states this pain episode was worse than it had been previously She continues to have bump dyspareunia 100% of the time  MEDS ordered this encounter: No orders of the defined types were placed in this encounter.   Orders for this encounter: No orders of the defined types were placed in this encounter.   Impression + Management Plan   ICD-10-CM   1. Pelvic pain  R10.2     2. 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     All questions were answered.  Past Medical History:  Diagnosis Date   Eczema    Headache(784.0)    Migraines   Hypertension    with this pregnancy   Postpartum depression    Urinary tract infection    Vaginal Pap smear, abnormal     Past Surgical History:  Procedure Laterality Date   DILATATION AND CURETTAGE/HYSTEROSCOPY WITH MINERVA N/A 10/23/2019   Procedure: DILATATION AND CURETTAGE/HYSTEROSCOPY WITH MINERVA;  Surgeon: 10/25/2019, MD;  Location: [facility];  Service: Gynecology;  Laterality: N/A;   LAPAROSCOPIC BILATERAL SALPINGECTOMY Bilateral 07/17/2019   Procedure: LAPAROSCOPIC BILATERAL SALPINGECTOMY;  Surgeon: 07/19/2019, MD;  Location: [facility];  Service: Gynecology;  Laterality: Bilateral;   NO PAST SURGERIES     POLYPECTOMY N/A 10/23/2019   Procedure: POLYPECTOMY(Endometrial Polyp Removal);  Surgeon: 10/25/2019, MD;  Location: [facility];  Service: Gynecology;  Laterality: N/A;   REMOVAL OF NON VAGINAL CONTRACEPTIVE DEVICE Left 07/17/2019   Procedure: REMOVAL OF NON VAGINAL CONTRACEPTIVE DEVICE, NEXPLANON;  Surgeon: 07/19/2019, MD;  Location: [facility];  Service: Gynecology;  Laterality: Left;    OB History     Gravida  2   Para  2   Term  2   Preterm  0   AB  0   Living  2      SAB  0   IAB  0   Ectopic  0   Multiple  0   Live Births  2           Allergies  Allergen Reactions   Doxycycline Hives and Nausea And Vomiting    Social History   Socioeconomic History   Marital status: Significant Other    Spouse name: Not on file   Number of children: Not on file   Years of education: Not on file   Highest education level: Not on file  Occupational History   Not on file  Tobacco Use   Smoking status: Never   Smokeless tobacco:  Never  Vaping Use   Vaping Use: Never used  Substance and Sexual Activity   Alcohol use: Yes    Comment: monthly   Drug use: No   Sexual activity: Yes    Birth control/protection: Surgical    Comment: tubal & ablation  Other Topics Concern   Not on file  Social History Narrative   Not on file   Social Determinants of Health   Financial Resource Strain: Not on file  Food Insecurity: Not on file  Transportation Needs: Not on file  Physical Activity: Not on file  Stress: Not on file  Social Connections: Not on file    Family History  Problem Relation Age of Onset   Diabetes Paternal Grandmother    Heart disease Paternal Grandmother    Cancer Maternal Grandmother        colon   Heart attack Father    Hypertension Mother    Irritable bowel syndrome Son    Crohn's disease Son    Cancer Maternal Aunt        skin   Cancer Maternal Uncle        lung cancer   Other Neg Hx

## 2021-10-27 ENCOUNTER — Encounter: Payer: Self-pay | Admitting: Obstetrics & Gynecology

## 2021-11-26 NOTE — Patient Instructions (Addendum)
Shelly Hancock  11/26/2021     @   Your procedure is scheduled on  12/02/2021.   Report to [facility] at  0750  A.M.   Call this number if you have problems the morning of surgery:  [phone]   Remember:  Do not eat after midnight.   You may drink clear liquids until  0600 am on 12/02/2021 .  Clear liquids allowed are:                    Water, Juice (No red color; non-citric and without pulp; diabetics please choose diet or no sugar options), Carbonated beverages (diabetics please choose diet or no sugar options), Clear Tea (No creamer, milk, or cream, including half & half and powdered creamer), Black Coffee Only (No creamer, milk or cream, including half & half and powdered creamer), Plain Jell-O Only (No red color; diabetics please choose no sugar options), Clear Sports drink (No red color; diabetics please choose diet or no sugar options), and Plain Popsicles Only (No red color; diabetics please choose no sugar options)      At 0600 am on 12/02/2021 drink your carb drink. You can have nothing else to drink after this.     Take these medicines the morning of surgery with A SIP OF WATER                                             None     Do not wear jewelry, make-up or nail polish.  Do not wear lotions, powders, or perfumes, or deodorant.  Do not shave 48 hours prior to surgery.  Men may shave face and neck.  Do not bring valuables to the hospital.  [facility] is not responsible for any belongings or valuables.  Contacts, dentures or bridgework may not be worn into surgery.  Leave your suitcase in the car.  After surgery it may be brought to your room.  For patients admitted to the hospital, discharge time will be determined by your treatment team.  Patients discharged the day of surgery will not be allowed to drive home and must have someone with them for 24 hours.    Special instructions:   DO NOT smoke tobacco or vape for 24 hours  before your procedure.  Please read over the following fact sheets that you were given. Pain Booklet, Coughing and Deep Breathing, Surgical Site Infection Prevention, Anesthesia Post-op Instructions, and Care and Recovery After Surgery      Vaginal Hysterectomy, Care After The following information offers guidance on how to care for yourself after your procedure. Your health care provider may also give you more specific instructions. If you have problems or questions, contact your health care provider. What can I expect after the procedure? After the procedure, it is common to have: Pain in the lower abdomen and vagina. Vaginal bleeding and discharge for up to 1 week. You will need to use a sanitary pad after this procedure. Difficulty having a bowel movement (constipation). Temporary problems emptying the bladder. Tiredness (fatigue). Poor appetite. Less interest in sex. Feelings of sadness or other emotions. If your ovaries were also removed, it is also common to have symptoms of menopause, such as hot flashes, night sweats, and lack of sleep (insomnia). Follow these instructions at home: Medicines  Take over-the-counter  and prescription medicines only as told by your health care provider. Ask your health care provider if the medicine prescribed to you: Requires you to avoid driving or using machinery. Can cause constipation. You may need to take these actions to prevent or treat constipation: Drink enough fluid to keep your urine pale yellow. Take over-the-counter or prescription medicines. Eat foods that are high in fiber, such as beans, whole grains, and fresh fruits and vegetables. Limit foods that are high in fat and processed sugars, such as fried or sweet foods. Activity  Rest as told by your health care provider. Return to your normal activities as told by your health care provider. Ask your health care provider what activities are safe for you Avoid sitting for a long  time without moving. Get up to take short walks every 1-2 hours. This is important to improve blood flow and breathing. Ask for help if you feel weak or unsteady. Try to have someone home with you for 1-2 weeks to help you with everyday chores. Do not lift anything that is heavier than 10 lb (4.5 kg), or the limit that you are told, until your health care provider says that it is safe. If you were given a sedative during the procedure, it can affect you for several hours. Do not drive or operate machinery until your health care provider says that it is safe. Lifestyle Do not use any products that contain nicotine or tobacco. These products include cigarettes, chewing tobacco, and vaping devices, such as e-cigarettes. These can delay healing after surgery. If you need help quitting, ask your health care provider. Do not drink alcohol until your health care provider approves. General instructions Do not douche, use tampons, or have sex for at least 6 weeks, or as told by your health care provider. If you struggle with physical or emotional changes after your procedure, speak with your health care provider or a therapist. The stitches inside your vagina will dissolve over time and do not need to be taken out. Do not take baths, swim, or use a hot tub until your health care provider approves. You may only be allowed to take showers for 2-3 weeks Wear compression stockings as told by your health care provider. These stockings help to prevent blood clots and reduce swelling in your legs. Keep all follow-up visits. This is important. Contact a health care provider if: Your pain medicine is not helping. You have a fever. You have nausea or vomiting that does not go away. You feel dizzy. You have blood, pus, or a bad-smelling discharge from your vagina more than 1 week after the procedure. You continue to have trouble urinating 3-5 days after the procedure. Get help right away if: You have severe pain  in your abdomen or back. You faint. You have heavy vaginal bleeding and blood clots, soaking through a sanitary pad in less than 1 hour. You have chest pain or shortness of breath. You have pain, swelling, or redness in your leg. These symptoms may represent a serious problem that is an emergency. Do not wait to see if the symptoms will go away. Get medical help right away. Call your local emergency services (911 in the [place] Do not drive yourself to the hospital. Summary After the procedure, it is common to have pain, vaginal bleeding, constipation, temporary problems emptying your bladder, and feelings of sadness or other emotions. Take over-the-counter and prescription medicines only as told by your health care provider. Rest as told by your  health care provider. Return to your normal activities as told by your health care provider. Contact a health care provider if your pain medicine is not helping, or you have a fever, dizziness, or trouble urinating several days after the procedure. Get help right away if you have severe pain in your abdomen or back, or if you faint, have heavy bleeding, or have chest pain or shortness of breath. This information is not intended to replace advice given to you by your health care provider. Make sure you discuss any questions you have with your health care provider. Document Revised: 05/27/2021 Document Reviewed: 11/16/2019 Elsevier Patient Education  2023 [employer] General Anesthesia, Adult, Care After The following information offers guidance on how to care for yourself after your procedure. Your health care provider may also give you more specific instructions. If you have problems or questions, contact your health care provider. What can I expect after the procedure? After the procedure, it is common for people to: Have pain or discomfort at the IV site. Have nausea or vomiting. Have a sore throat or hoarseness. Have trouble concentrating. Feel  cold or chills. Feel weak, sleepy, or tired (fatigue). Have soreness and body aches. These can affect parts of the body that were not involved in surgery. Follow these instructions at home: For the time period you were told by your health care provider:  Rest. Do not participate in activities where you could fall or become injured. Do not drive or use machinery. Do not drink alcohol. Do not take sleeping pills or medicines that cause drowsiness. Do not make important decisions or sign legal documents. Do not take care of children on your own. General instructions Drink enough fluid to keep your urine pale yellow. If you have sleep apnea, surgery and certain medicines can increase your risk for breathing problems. Follow instructions from your health care provider about wearing your sleep device: Anytime you are sleeping, including during daytime naps. While taking prescription pain medicines, sleeping medicines, or medicines that make you drowsy. Return to your normal activities as told by your health care provider. Ask your health care provider what activities are safe for you. Take over-the-counter and prescription medicines only as told by your health care provider. Do not use any products that contain nicotine or tobacco. These products include cigarettes, chewing tobacco, and vaping devices, such as e-cigarettes. These can delay incision healing after surgery. If you need help quitting, ask your health care provider. Contact a health care provider if: You have nausea or vomiting that does not get better with medicine. You vomit every time you eat or drink. You have pain that does not get better with medicine. You cannot urinate or have bloody urine. You develop a skin rash. You have a fever. Get help right away if: You have trouble breathing. You have chest pain. You vomit blood. These symptoms may be an emergency. Get help right away. Call 911. Do not wait to see if the  symptoms will go away. Do not drive yourself to the hospital. Summary After the procedure, it is common to have a sore throat, hoarseness, nausea, vomiting, or to feel weak, sleepy, or fatigue. For the time period you were told by your health care provider, do not drive or use machinery. Get help right away if you have difficulty breathing, have chest pain, or vomit blood. These symptoms may be an emergency. This information is not intended to replace advice given to you by your health care provider. Make  sure you discuss any questions you have with your health care provider. Document Revised: 06/12/2021 Document Reviewed: 06/12/2021 Elsevier Patient Education  2023 [employer] Inc. How to Use Chlorhexidine Before Surgery Chlorhexidine gluconate (CHG) is a germ-killing (antiseptic) solution that is used to clean the skin. It can get rid of the bacteria that normally live on the skin and can keep them away for about 24 hours. To clean your skin with CHG, you may be given: A CHG solution to use in the shower or as part of a sponge bath. A prepackaged cloth that contains CHG. Cleaning your skin with CHG may help lower the risk for infection: While you are staying in the intensive care unit of the hospital. If you have a vascular access, such as a central line, to provide short-term or long-term access to your veins. If you have a catheter to drain urine from your bladder. If you are on a ventilator. A ventilator is a machine that helps you breathe by moving air in and out of your lungs. After surgery. What are the risks? Risks of using CHG include: A skin reaction. Hearing loss, if CHG gets in your ears and you have a perforated eardrum. Eye injury, if CHG gets in your eyes and is not rinsed out. The CHG product catching fire. Make sure that you avoid smoking and flames after applying CHG to your skin. Do not use CHG: If you have a chlorhexidine allergy or have previously reacted to  chlorhexidine. On babies younger than 108 months of age. How to use CHG solution Use CHG only as told by your health care provider, and follow the instructions on the label. Use the full amount of CHG as directed. Usually, this is one bottle. During a shower Follow these steps when using CHG solution during a shower (unless your health care provider gives you different instructions): Start the shower. Use your normal soap and shampoo to wash your face and hair. Turn off the shower or move out of the shower stream. Pour the CHG onto a clean washcloth. Do not use any type of brush or rough-edged sponge. Starting at your neck, lather your body down to your toes. Make sure you follow these instructions: If you will be having surgery, pay special attention to the part of your body where you will be having surgery. Scrub this area for at least 1 minute. Do not use CHG on your head or face. If the solution gets into your ears or eyes, rinse them well with water. Avoid your genital area. Avoid any areas of skin that have broken skin, cuts, or scrapes. Scrub your back and under your arms. Make sure to wash skin folds. Let the lather sit on your skin for 1-2 minutes or as long as told by your health care provider. Thoroughly rinse your entire body in the shower. Make sure that all body creases and crevices are rinsed well. Dry off with a clean towel. Do not put any substances on your body afterward--such as powder, lotion, or perfume--unless you are told to do so by your health care provider. Only use lotions that are recommended by the manufacturer. Put on clean clothes or pajamas. If it is the night before your surgery, sleep in clean sheets.  During a sponge bath Follow these steps when using CHG solution during a sponge bath (unless your health care provider gives you different instructions): Use your normal soap and shampoo to wash your face and hair. Pour the CHG onto a  clean washcloth. Starting  at your neck, lather your body down to your toes. Make sure you follow these instructions: If you will be having surgery, pay special attention to the part of your body where you will be having surgery. Scrub this area for at least 1 minute. Do not use CHG on your head or face. If the solution gets into your ears or eyes, rinse them well with water. Avoid your genital area. Avoid any areas of skin that have broken skin, cuts, or scrapes. Scrub your back and under your arms. Make sure to wash skin folds. Let the lather sit on your skin for 1-2 minutes or as long as told by your health care provider. Using a different clean, wet washcloth, thoroughly rinse your entire body. Make sure that all body creases and crevices are rinsed well. Dry off with a clean towel. Do not put any substances on your body afterward--such as powder, lotion, or perfume--unless you are told to do so by your health care provider. Only use lotions that are recommended by the manufacturer. Put on clean clothes or pajamas. If it is the night before your surgery, sleep in clean sheets. How to use CHG prepackaged cloths Only use CHG cloths as told by your health care provider, and follow the instructions on the label. Use the CHG cloth on clean, dry skin. Do not use the CHG cloth on your head or face unless your health care provider tells you to. When washing with the CHG cloth: Avoid your genital area. Avoid any areas of skin that have broken skin, cuts, or scrapes. Before surgery Follow these steps when using a CHG cloth to clean before surgery (unless your health care provider gives you different instructions): Using the CHG cloth, vigorously scrub the part of your body where you will be having surgery. Scrub using a back-and-forth motion for 3 minutes. The area on your body should be completely wet with CHG when you are done scrubbing. Do not rinse. Discard the cloth and let the area air-dry. Do not put any substances on  the area afterward, such as powder, lotion, or perfume. Put on clean clothes or pajamas. If it is the night before your surgery, sleep in clean sheets.  For general bathing Follow these steps when using CHG cloths for general bathing (unless your health care provider gives you different instructions). Use a separate CHG cloth for each area of your body. Make sure you wash between any folds of skin and between your fingers and toes. Wash your body in the following order, switching to a new cloth after each step: The front of your neck, shoulders, and chest. Both of your arms, under your arms, and your hands. Your stomach and groin area, avoiding the genitals. Your right leg and foot. Your left leg and foot. The back of your neck, your back, and your buttocks. Do not rinse. Discard the cloth and let the area air-dry. Do not put any substances on your body afterward--such as powder, lotion, or perfume--unless you are told to do so by your health care provider. Only use lotions that are recommended by the manufacturer. Put on clean clothes or pajamas. Contact a health care provider if: Your skin gets irritated after scrubbing. You have questions about using your solution or cloth. You swallow any chlorhexidine. Call your local poison control center ([phone] in the [place] Get help right away if: Your eyes itch badly, or they become very red or swollen. Your skin itches badly and  is red or swollen. Your hearing changes. You have trouble seeing. You have swelling or tingling in your mouth or throat. You have trouble breathing. These symptoms may represent a serious problem that is an emergency. Do not wait to see if the symptoms will go away. Get medical help right away. Call your local emergency services (911 in the [place] Do not drive yourself to the hospital. Summary Chlorhexidine gluconate (CHG) is a germ-killing (antiseptic) solution that is used to clean the skin. Cleaning your skin  with CHG may help to lower your risk for infection. You may be given CHG to use for bathing. It may be in a bottle or in a prepackaged cloth to use on your skin. Carefully follow your health care provider's instructions and the instructions on the product label. Do not use CHG if you have a chlorhexidine allergy. Contact your health care provider if your skin gets irritated after scrubbing. This information is not intended to replace advice given to you by your health care provider. Make sure you discuss any questions you have with your health care provider. Document Revised: 07/13/2021 Document Reviewed: 05/26/2020 Elsevier Patient Education  2023 [employer].

## 2021-11-27 ENCOUNTER — Encounter (HOSPITAL_COMMUNITY): Payer: Self-pay

## 2021-11-27 ENCOUNTER — Encounter (HOSPITAL_COMMUNITY)
Admission: RE | Admit: 2021-11-27 | Discharge: 2021-11-27 | Disposition: A | Discharge status: Home / Self Care | Payer: 59 | Source: Ambulatory Visit | Attending: Obstetrics & Gynecology | Admitting: Obstetrics & Gynecology

## 2021-11-27 VITALS — BP 133/85 | HR 77 | Temp 97.6°F | Resp 18 | Ht 65.0 in | Wt 260.0 lb

## 2021-11-27 DIAGNOSIS — N840 Polyp of corpus uteri: Secondary | ICD-10-CM | POA: Diagnosis not present

## 2021-11-27 DIAGNOSIS — N939 Abnormal uterine and vaginal bleeding, unspecified: Secondary | ICD-10-CM | POA: Diagnosis not present

## 2021-11-27 DIAGNOSIS — R1032 Left lower quadrant pain: Secondary | ICD-10-CM

## 2021-11-27 DIAGNOSIS — N941 Unspecified dyspareunia: Secondary | ICD-10-CM

## 2021-11-27 DIAGNOSIS — N920 Excessive and frequent menstruation with regular cycle: Secondary | ICD-10-CM

## 2021-11-27 DIAGNOSIS — Z01812 Encounter for preprocedural laboratory examination: Secondary | ICD-10-CM | POA: Diagnosis present

## 2021-11-27 DIAGNOSIS — Z01818 Encounter for other preprocedural examination: Secondary | ICD-10-CM

## 2021-11-27 HISTORY — DX: Other specified postprocedural states: Z98.890

## 2021-11-27 LAB — CBC WITH DIFFERENTIAL/PLATELET
Abs Immature Granulocytes: 0.01 10*3/uL (ref 0.00–0.07)
Basophils Absolute: 0 10*3/uL (ref 0.0–0.1)
Basophils Relative: 1 %
Eosinophils Absolute: 0.1 10*3/uL (ref 0.0–0.5)
Eosinophils Relative: 1 %
HCT: 36.2 % (ref 36.0–46.0)
Hemoglobin: 12.4 g/dL (ref 12.0–15.0)
Immature Granulocytes: 0 %
Lymphocytes Relative: 28 %
Lymphs Abs: 1.7 10*3/uL (ref 0.7–4.0)
MCH: 31.3 pg (ref 26.0–34.0)
MCHC: 34.3 g/dL (ref 30.0–36.0)
MCV: 91.4 fL (ref 80.0–100.0)
Monocytes Absolute: 0.2 10*3/uL (ref 0.1–1.0)
Monocytes Relative: 4 %
Neutro Abs: 4 10*3/uL (ref 1.7–7.7)
Neutrophils Relative %: 66 %
Platelets: 233 10*3/uL (ref 150–400)
RBC: 3.96 MIL/uL (ref 3.87–5.11)
RDW: 12.2 % (ref 11.5–15.5)
WBC: 6.1 10*3/uL (ref 4.0–10.5)
nRBC: 0 % (ref 0.0–0.2)

## 2021-11-27 LAB — COMPREHENSIVE METABOLIC PANEL
ALT: 22 U/L (ref 0–44)
AST: 22 U/L (ref 15–41)
Albumin: 3.7 g/dL (ref 3.5–5.0)
Alkaline Phosphatase: 76 U/L (ref 38–126)
Anion gap: 10 (ref 5–15)
BUN: 16 mg/dL (ref 6–20)
CO2: 23 mmol/L (ref 22–32)
Calcium: 8.9 mg/dL (ref 8.9–10.3)
Chloride: 104 mmol/L (ref 98–111)
Creatinine, Ser: 0.66 mg/dL (ref 0.44–1.00)
GFR, Estimated: >60 mL/min (ref >60)
Glucose, Bld: 108 mg/dL — ABNORMAL HIGH (ref 70–99)
Potassium: 3.5 mmol/L (ref 3.5–5.1)
Sodium: 137 mmol/L (ref 135–145)
Total Bilirubin: 0.5 mg/dL (ref 0.3–1.2)
Total Protein: 7 g/dL (ref 6.5–8.1)

## 2021-11-27 LAB — TYPE AND SCREEN
ABO/RH(D): O POS
Antibody Screen: NEGATIVE

## 2021-11-27 LAB — URINALYSIS, ROUTINE W REFLEX MICROSCOPIC
Bilirubin Urine: NEGATIVE
Glucose, UA: NEGATIVE mg/dL
Hgb urine dipstick: NEGATIVE
Ketones, ur: NEGATIVE mg/dL
Leukocytes,Ua: NEGATIVE
Nitrite: NEGATIVE
Protein, ur: NEGATIVE mg/dL
Specific Gravity, Urine: 1.016 (ref 1.005–1.030)
pH: 8 (ref 5.0–8.0)

## 2021-11-27 LAB — POCT PREGNANCY, URINE: Preg Test, Ur: NEGATIVE

## 2021-12-15 ENCOUNTER — Encounter: Payer: 59 | Admitting: Obstetrics & Gynecology

## 2021-12-16 NOTE — Patient Instructions (Signed)
Shelly Hancock  12/16/2021     @PREFPERIOPPHARMACY @   Your procedure is scheduled on  12/23/2021.   Report to [facility] at  [phone] A.M.   Call this number if you have problems the morning of surgery:  [phone]   Remember:  Do not eat after midnight.   You may drink clear liquids until  0700 AM on 12/23/2021.     Clear liquids allowed are:                    Water, Juice (No red color; non-citric and without pulp; diabetics please choose diet or no sugar options), Carbonated beverages (diabetics please choose diet or no sugar options), Clear Tea (No creamer, milk, or cream, including half & half and powdered creamer), Black Coffee Only (No creamer, milk or cream, including half & half and powdered creamer), Plain Jell-O Only (No red color; diabetics please choose no sugar options), Clear Sports drink (No red color; diabetics please choose diet or no sugar options), and Plain Popsicles Only (No red color; diabetics please choose no sugar options)        At 0700 AM on 12/23/2021 drink your carb drink. You cannot have anything else to drink after this.    Take these medicines the morning of surgery with A SIP OF WATER                                                           None     Do not wear jewelry, make-up or nail polish.  Do not wear lotions, powders, or perfumes, or deodorant.  Do not shave 48 hours prior to surgery.  Men may shave face and neck.  Do not bring valuables to the hospital.  [facility] is not responsible for any belongings or valuables.  Contacts, dentures or bridgework may not be worn into surgery.  Leave your suitcase in the car.  After surgery it may be brought to your room.  For patients admitted to the hospital, discharge time will be determined by your treatment team.  Patients discharged the day of surgery will not be allowed to drive home and must have someone with them for 24 hours.     Special instructions:   DO NOT smoke  tobacco or vape for 24 hours before your procedure.  Please read over the following fact sheets that you were given. Pain Booklet, Coughing and Deep Breathing, Blood Transfusion Information, Surgical Site Infection Prevention, Anesthesia Post-op Instructions, and Care and Recovery After Surgery          Vaginal Hysterectomy, Care After The following information offers guidance on how to care for yourself after your procedure. Your health care provider may also give you more specific instructions. If you have problems or questions, contact your health care provider. What can I expect after the procedure? After the procedure, it is common to have: Pain in the lower abdomen and vagina. Vaginal bleeding and discharge for up to 1 week. You will need to use a sanitary pad after this procedure. Difficulty having a bowel movement (constipation). Temporary problems emptying the bladder. Tiredness (fatigue). Poor appetite. Less interest in sex. Feelings of sadness or other emotions. If your ovaries were also removed, it is also  common to have symptoms of menopause, such as hot flashes, night sweats, and lack of sleep (insomnia). Follow these instructions at home: Medicines  Take over-the-counter and prescription medicines only as told by your health care provider. Ask your health care provider if the medicine prescribed to you: Requires you to avoid driving or using machinery. Can cause constipation. You may need to take these actions to prevent or treat constipation: Drink enough fluid to keep your urine pale yellow. Take over-the-counter or prescription medicines. Eat foods that are high in fiber, such as beans, whole grains, and fresh fruits and vegetables. Limit foods that are high in fat and processed sugars, such as fried or sweet foods. Activity  Rest as told by your health care provider. Return to your normal activities as told by your health care provider. Ask your health care  provider what activities are safe for you Avoid sitting for a long time without moving. Get up to take short walks every 1-2 hours. This is important to improve blood flow and breathing. Ask for help if you feel weak or unsteady. Try to have someone home with you for 1-2 weeks to help you with everyday chores. Do not lift anything that is heavier than 10 lb (4.5 kg), or the limit that you are told, until your health care provider says that it is safe. If you were given a sedative during the procedure, it can affect you for several hours. Do not drive or operate machinery until your health care provider says that it is safe. Lifestyle Do not use any products that contain nicotine or tobacco. These products include cigarettes, chewing tobacco, and vaping devices, such as e-cigarettes. These can delay healing after surgery. If you need help quitting, ask your health care provider. Do not drink alcohol until your health care provider approves. General instructions Do not douche, use tampons, or have sex for at least 6 weeks, or as told by your health care provider. If you struggle with physical or emotional changes after your procedure, speak with your health care provider or a therapist. The stitches inside your vagina will dissolve over time and do not need to be taken out. Do not take baths, swim, or use a hot tub until your health care provider approves. You may only be allowed to take showers for 2-3 weeks Wear compression stockings as told by your health care provider. These stockings help to prevent blood clots and reduce swelling in your legs. Keep all follow-up visits. This is important. Contact a health care provider if: Your pain medicine is not helping. You have a fever. You have nausea or vomiting that does not go away. You feel dizzy. You have blood, pus, or a bad-smelling discharge from your vagina more than 1 week after the procedure. You continue to have trouble urinating 3-5 days  after the procedure. Get help right away if: You have severe pain in your abdomen or back. You faint. You have heavy vaginal bleeding and blood clots, soaking through a sanitary pad in less than 1 hour. You have chest pain or shortness of breath. You have pain, swelling, or redness in your leg. These symptoms may represent a serious problem that is an emergency. Do not wait to see if the symptoms will go away. Get medical help right away. Call your local emergency services (911 in the [place] Do not drive yourself to the hospital. Summary After the procedure, it is common to have pain, vaginal bleeding, constipation, temporary problems emptying your  bladder, and feelings of sadness or other emotions. Take over-the-counter and prescription medicines only as told by your health care provider. Rest as told by your health care provider. Return to your normal activities as told by your health care provider. Contact a health care provider if your pain medicine is not helping, or you have a fever, dizziness, or trouble urinating several days after the procedure. Get help right away if you have severe pain in your abdomen or back, or if you faint, have heavy bleeding, or have chest pain or shortness of breath. This information is not intended to replace advice given to you by your health care provider. Make sure you discuss any questions you have with your health care provider. Document Revised: 05/27/2021 Document Reviewed: 11/16/2019 Elsevier Patient Education  [facility] Anesthesia, Adult, Care After The following information offers guidance on how to care for yourself after your procedure. Your health care provider may also give you more specific instructions. If you have problems or questions, contact your health care provider. What can I expect after the procedure? After the procedure, it is common for people to: Have pain or discomfort at the IV site. Have nausea or  vomiting. Have a sore throat or hoarseness. Have trouble concentrating. Feel cold or chills. Feel weak, sleepy, or tired (fatigue). Have soreness and body aches. These can affect parts of the body that were not involved in surgery. Follow these instructions at home: For the time period you were told by your health care provider:  Rest. Do not participate in activities where you could fall or become injured. Do not drive or use machinery. Do not drink alcohol. Do not take sleeping pills or medicines that cause drowsiness. Do not make important decisions or sign legal documents. Do not take care of children on your own. General instructions Drink enough fluid to keep your urine pale yellow. If you have sleep apnea, surgery and certain medicines can increase your risk for breathing problems. Follow instructions from your health care provider about wearing your sleep device: Anytime you are sleeping, including during daytime naps. While taking prescription pain medicines, sleeping medicines, or medicines that make you drowsy. Return to your normal activities as told by your health care provider. Ask your health care provider what activities are safe for you. Take over-the-counter and prescription medicines only as told by your health care provider. Do not use any products that contain nicotine or tobacco. These products include cigarettes, chewing tobacco, and vaping devices, such as e-cigarettes. These can delay incision healing after surgery. If you need help quitting, ask your health care provider. Contact a health care provider if: You have nausea or vomiting that does not get better with medicine. You vomit every time you eat or drink. You have pain that does not get better with medicine. You cannot urinate or have bloody urine. You develop a skin rash. You have a fever. Get help right away if: You have trouble breathing. You have chest pain. You vomit blood. These symptoms may be  an emergency. Get help right away. Call 911. Do not wait to see if the symptoms will go away. Do not drive yourself to the hospital. Summary After the procedure, it is common to have a sore throat, hoarseness, nausea, vomiting, or to feel weak, sleepy, or fatigue. For the time period you were told by your health care provider, do not drive or use machinery. Get help right away if you have difficulty breathing, have chest pain,  or vomit blood. These symptoms may be an emergency. This information is not intended to replace advice given to you by your health care provider. Make sure you discuss any questions you have with your health care provider. Document Revised: 06/12/2021 Document Reviewed: 06/12/2021 Elsevier Patient Education  [place]. How to Use Chlorhexidine Before Surgery Chlorhexidine gluconate (CHG) is a germ-killing (antiseptic) solution that is used to clean the skin. It can get rid of the bacteria that normally live on the skin and can keep them away for about 24 hours. To clean your skin with CHG, you may be given: A CHG solution to use in the shower or as part of a sponge bath. A prepackaged cloth that contains CHG. Cleaning your skin with CHG may help lower the risk for infection: While you are staying in the intensive care unit of the hospital. If you have a vascular access, such as a central line, to provide short-term or long-term access to your veins. If you have a catheter to drain urine from your bladder. If you are on a ventilator. A ventilator is a machine that helps you breathe by moving air in and out of your lungs. After surgery. What are the risks? Risks of using CHG include: A skin reaction. Hearing loss, if CHG gets in your ears and you have a perforated eardrum. Eye injury, if CHG gets in your eyes and is not rinsed out. The CHG product catching fire. Make sure that you avoid smoking and flames after applying CHG to your skin. Do not use CHG: If  you have a chlorhexidine allergy or have previously reacted to chlorhexidine. On babies younger than 51 months of age. How to use CHG solution Use CHG only as told by your health care provider, and follow the instructions on the label. Use the full amount of CHG as directed. Usually, this is one bottle. During a shower Follow these steps when using CHG solution during a shower (unless your health care provider gives you different instructions): Start the shower. Use your normal soap and shampoo to wash your face and hair. Turn off the shower or move out of the shower stream. Pour the CHG onto a clean washcloth. Do not use any type of brush or rough-edged sponge. Starting at your neck, lather your body down to your toes. Make sure you follow these instructions: If you will be having surgery, pay special attention to the part of your body where you will be having surgery. Scrub this area for at least 1 minute. Do not use CHG on your head or face. If the solution gets into your ears or eyes, rinse them well with water. Avoid your genital area. Avoid any areas of skin that have broken skin, cuts, or scrapes. Scrub your back and under your arms. Make sure to wash skin folds. Let the lather sit on your skin for 1-2 minutes or as long as told by your health care provider. Thoroughly rinse your entire body in the shower. Make sure that all body creases and crevices are rinsed well. Dry off with a clean towel. Do not put any substances on your body afterward--such as powder, lotion, or perfume--unless you are told to do so by your health care provider. Only use lotions that are recommended by the manufacturer. Put on clean clothes or pajamas. If it is the night before your surgery, sleep in clean sheets.  During a sponge bath Follow these steps when using CHG solution during a sponge bath (  unless your health care provider gives you different instructions): Use your normal soap and shampoo to wash your  face and hair. Pour the CHG onto a clean washcloth. Starting at your neck, lather your body down to your toes. Make sure you follow these instructions: If you will be having surgery, pay special attention to the part of your body where you will be having surgery. Scrub this area for at least 1 minute. Do not use CHG on your head or face. If the solution gets into your ears or eyes, rinse them well with water. Avoid your genital area. Avoid any areas of skin that have broken skin, cuts, or scrapes. Scrub your back and under your arms. Make sure to wash skin folds. Let the lather sit on your skin for 1-2 minutes or as long as told by your health care provider. Using a different clean, wet washcloth, thoroughly rinse your entire body. Make sure that all body creases and crevices are rinsed well. Dry off with a clean towel. Do not put any substances on your body afterward--such as powder, lotion, or perfume--unless you are told to do so by your health care provider. Only use lotions that are recommended by the manufacturer. Put on clean clothes or pajamas. If it is the night before your surgery, sleep in clean sheets. How to use CHG prepackaged cloths Only use CHG cloths as told by your health care provider, and follow the instructions on the label. Use the CHG cloth on clean, dry skin. Do not use the CHG cloth on your head or face unless your health care provider tells you to. When washing with the CHG cloth: Avoid your genital area. Avoid any areas of skin that have broken skin, cuts, or scrapes. Before surgery Follow these steps when using a CHG cloth to clean before surgery (unless your health care provider gives you different instructions): Using the CHG cloth, vigorously scrub the part of your body where you will be having surgery. Scrub using a back-and-forth motion for 3 minutes. The area on your body should be completely wet with CHG when you are done scrubbing. Do not rinse. Discard the  cloth and let the area air-dry. Do not put any substances on the area afterward, such as powder, lotion, or perfume. Put on clean clothes or pajamas. If it is the night before your surgery, sleep in clean sheets.  For general bathing Follow these steps when using CHG cloths for general bathing (unless your health care provider gives you different instructions). Use a separate CHG cloth for each area of your body. Make sure you wash between any folds of skin and between your fingers and toes. Wash your body in the following order, switching to a new cloth after each step: The front of your neck, shoulders, and chest. Both of your arms, under your arms, and your hands. Your stomach and groin area, avoiding the genitals. Your right leg and foot. Your left leg and foot. The back of your neck, your back, and your buttocks. Do not rinse. Discard the cloth and let the area air-dry. Do not put any substances on your body afterward--such as powder, lotion, or perfume--unless you are told to do so by your health care provider. Only use lotions that are recommended by the manufacturer. Put on clean clothes or pajamas. Contact a health care provider if: Your skin gets irritated after scrubbing. You have questions about using your solution or cloth. You swallow any chlorhexidine. Call your local poison control  center (1-(847) 273-2624 in the [place] Get help right away if: Your eyes itch badly, or they become very red or swollen. Your skin itches badly and is red or swollen. Your hearing changes. You have trouble seeing. You have swelling or tingling in your mouth or throat. You have trouble breathing. These symptoms may represent a serious problem that is an emergency. Do not wait to see if the symptoms will go away. Get medical help right away. Call your local emergency services (911 in the [place] Do not drive yourself to the hospital. Summary Chlorhexidine gluconate (CHG) is a germ-killing  (antiseptic) solution that is used to clean the skin. Cleaning your skin with CHG may help to lower your risk for infection. You may be given CHG to use for bathing. It may be in a bottle or in a prepackaged cloth to use on your skin. Carefully follow your health care provider's instructions and the instructions on the product label. Do not use CHG if you have a chlorhexidine allergy. Contact your health care provider if your skin gets irritated after scrubbing. This information is not intended to replace advice given to you by your health care provider. Make sure you discuss any questions you have with your health care provider. Document Revised: 07/13/2021 Document Reviewed: 05/26/2020 Elsevier Patient Education  [place].

## 2021-12-17 ENCOUNTER — Other Ambulatory Visit: Payer: Self-pay | Admitting: Obstetrics & Gynecology

## 2021-12-17 DIAGNOSIS — Z01818 Encounter for other preprocedural examination: Secondary | ICD-10-CM

## 2021-12-18 ENCOUNTER — Encounter (HOSPITAL_COMMUNITY)
Admission: RE | Admit: 2021-12-18 | Discharge: 2021-12-18 | Disposition: A | Discharge status: Home / Self Care | Payer: Medicaid Other | Source: Ambulatory Visit | Attending: Obstetrics & Gynecology | Admitting: Obstetrics & Gynecology

## 2021-12-18 ENCOUNTER — Encounter (HOSPITAL_COMMUNITY): Payer: Self-pay

## 2021-12-18 DIAGNOSIS — Z01818 Encounter for other preprocedural examination: Secondary | ICD-10-CM | POA: Diagnosis present

## 2021-12-18 LAB — URINALYSIS, ROUTINE W REFLEX MICROSCOPIC
Bacteria, UA: NONE SEEN
Bilirubin Urine: NEGATIVE
Glucose, UA: NEGATIVE mg/dL
Hgb urine dipstick: NEGATIVE
Ketones, ur: NEGATIVE mg/dL
Nitrite: NEGATIVE
Protein, ur: NEGATIVE mg/dL
Specific Gravity, Urine: 1.016 (ref 1.005–1.030)
pH: 8 (ref 5.0–8.0)

## 2021-12-18 LAB — TYPE AND SCREEN
ABO/RH(D): O POS
Antibody Screen: NEGATIVE

## 2021-12-18 LAB — CBC
HCT: 35.9 % — ABNORMAL LOW (ref 36.0–46.0)
Hemoglobin: 13 g/dL (ref 12.0–15.0)
MCH: 32.7 pg (ref 26.0–34.0)
MCHC: 36.2 g/dL — ABNORMAL HIGH (ref 30.0–36.0)
MCV: 90.2 fL (ref 80.0–100.0)
Platelets: 236 10*3/uL (ref 150–400)
RBC: 3.98 MIL/uL (ref 3.87–5.11)
RDW: 12 % (ref 11.5–15.5)
WBC: 5.5 10*3/uL (ref 4.0–10.5)
nRBC: 0 % (ref 0.0–0.2)

## 2021-12-18 LAB — COMPREHENSIVE METABOLIC PANEL
ALT: 22 U/L (ref 0–44)
AST: 20 U/L (ref 15–41)
Albumin: 4 g/dL (ref 3.5–5.0)
Alkaline Phosphatase: 80 U/L (ref 38–126)
Anion gap: 8 (ref 5–15)
BUN: 16 mg/dL (ref 6–20)
CO2: 24 mmol/L (ref 22–32)
Calcium: 8.7 mg/dL — ABNORMAL LOW (ref 8.9–10.3)
Chloride: 105 mmol/L (ref 98–111)
Creatinine, Ser: 0.61 mg/dL (ref 0.44–1.00)
GFR, Estimated: >60 mL/min (ref >60)
Glucose, Bld: 97 mg/dL (ref 70–99)
Potassium: 3.7 mmol/L (ref 3.5–5.1)
Sodium: 137 mmol/L (ref 135–145)
Total Bilirubin: 0.7 mg/dL (ref 0.3–1.2)
Total Protein: 7.2 g/dL (ref 6.5–8.1)

## 2021-12-18 LAB — RAPID HIV SCREEN (HIV 1/2 AB+AG)
HIV 1/2 Antibodies: NONREACTIVE
HIV-1 P24 Antigen - HIV24: NONREACTIVE

## 2021-12-18 LAB — POCT PREGNANCY, URINE: Preg Test, Ur: NEGATIVE

## 2021-12-22 ENCOUNTER — Ambulatory Visit (HOSPITAL_COMMUNITY): Payer: Medicaid Other | Admitting: Anesthesiology

## 2021-12-23 ENCOUNTER — Encounter (HOSPITAL_COMMUNITY): Payer: Self-pay | Admitting: Obstetrics & Gynecology

## 2021-12-23 ENCOUNTER — Encounter (HOSPITAL_COMMUNITY)
Admission: RE | Disposition: A | Discharge status: Home / Self Care | Payer: Self-pay | Source: Home / Self Care | Attending: Obstetrics & Gynecology

## 2021-12-23 ENCOUNTER — Ambulatory Visit (HOSPITAL_COMMUNITY)
Admission: RE | Admit: 2021-12-23 | Discharge: 2021-12-23 | Disposition: A | Discharge status: Home / Self Care | Payer: Medicaid Other | Attending: Obstetrics & Gynecology | Admitting: Obstetrics & Gynecology

## 2021-12-23 ENCOUNTER — Telehealth: Payer: Self-pay | Admitting: Obstetrics & Gynecology

## 2021-12-23 DIAGNOSIS — Z538 Procedure and treatment not carried out for other reasons: Secondary | ICD-10-CM | POA: Diagnosis not present

## 2021-12-23 DIAGNOSIS — R102 Pelvic and perineal pain: Secondary | ICD-10-CM | POA: Insufficient documentation

## 2021-12-23 DIAGNOSIS — N941 Unspecified dyspareunia: Secondary | ICD-10-CM | POA: Diagnosis not present

## 2021-12-23 SURGERY — HYSTERECTOMY, VAGINAL
Anesthesia: General

## 2021-12-23 MED ORDER — KETOROLAC TROMETHAMINE 30 MG/ML IJ SOLN
30.0000 mg | Freq: Once | INTRAMUSCULAR | Status: AC
  Administered 2021-12-23: 30 mg via INTRAVENOUS
  Filled 2021-12-23: qty 1

## 2021-12-23 MED ORDER — POVIDONE-IODINE 10 % EX SWAB: 2.0000 | Freq: Once | CUTANEOUS | Status: DC

## 2021-12-23 MED ORDER — CEFAZOLIN SODIUM-DEXTROSE 2-4 GM/100ML-% IV SOLN: 2.0000 g | Freq: Once | INTRAVENOUS | Status: DC

## 2021-12-23 MED ORDER — CEFAZOLIN SODIUM-DEXTROSE 2-4 GM/100ML-% IV SOLN
2.0000 g | INTRAVENOUS | Status: DC
  Filled 2021-12-23: qty 100

## 2021-12-23 MED ORDER — KETOROLAC TROMETHAMINE 30 MG/ML IJ SOLN: 30.0000 mg | Freq: Once | INTRAMUSCULAR | Status: DC

## 2021-12-23 SURGICAL SUPPLY — 40 items
APPLIER CLIP 13 LRG OPEN (CLIP)
APR CLP LRG 13 20 CLIP (CLIP)
BAG HAMPER (MISCELLANEOUS) ×2 IMPLANT
BLADE SURG SZ10 CARB STEEL (BLADE) IMPLANT
CLIP APPLIE 13 LRG OPEN (CLIP) IMPLANT
CLOTH BEACON ORANGE TIMEOUT ST (SAFETY) ×2 IMPLANT
COVER LIGHT HANDLE STERIS (MISCELLANEOUS) ×4 IMPLANT
DRAPE HALF SHEET 40X57 (DRAPES) ×2 IMPLANT
DRAPE STERI URO 9X17 APER PCH (DRAPES) ×2 IMPLANT
ELECT REM PT RETURN 9FT ADLT (ELECTROSURGICAL) ×1
ELECTRODE REM PT RTRN 9FT ADLT (ELECTROSURGICAL) ×2 IMPLANT
GAUZE 4X4 16PLY ~~LOC~~+RFID DBL (SPONGE) ×2 IMPLANT
GLOVE BIOGEL PI IND STRL 7.0 (GLOVE) ×4 IMPLANT
GLOVE BIOGEL PI IND STRL 8 (GLOVE) ×2 IMPLANT
GLOVE ECLIPSE 8.0 STRL XLNG CF (GLOVE) ×4 IMPLANT
GLOVE SRG 8 PF TXTR STRL LF DI (GLOVE) ×2 IMPLANT
GLOVE SURG UNDER POLY LF SZ8 (GLOVE) ×1
GOWN STRL REUS W/TWL LRG LVL3 (GOWN DISPOSABLE) ×4 IMPLANT
GOWN STRL REUS W/TWL XL LVL3 (GOWN DISPOSABLE) ×2 IMPLANT
IV NS IRRIG 3000ML ARTHROMATIC (IV SOLUTION) ×2 IMPLANT
KIT BLADEGUARD II DBL (SET/KITS/TRAYS/PACK) ×2 IMPLANT
KIT TURNOVER CYSTO (KITS) ×2 IMPLANT
MANIFOLD NEPTUNE II (INSTRUMENTS) ×2 IMPLANT
NDL HYPO 21X1.5 SAFETY (NEEDLE) ×1 IMPLANT
NEEDLE HYPO 21X1.5 SAFETY (NEEDLE) ×1 IMPLANT
NS IRRIG 1000ML POUR BTL (IV SOLUTION) ×2 IMPLANT
PACK PERI GYN (CUSTOM PROCEDURE TRAY) ×2 IMPLANT
PAD ARMBOARD 7.5X6 YLW CONV (MISCELLANEOUS) ×2 IMPLANT
SET BASIN LINEN APH (SET/KITS/TRAYS/PACK) ×2 IMPLANT
SUT MNCRL+ AB 3-0 CT1 36 (SUTURE) IMPLANT
SUT MON AB 3-0 SH 27 (SUTURE) IMPLANT
SUT MONOCRYL AB 3-0 CT1 36IN (SUTURE)
SUT VIC AB 0 CT1 27 (SUTURE) ×2
SUT VIC AB 0 CT1 27XCR 8 STRN (SUTURE) ×4 IMPLANT
SYR CONTROL 10ML LL (SYRINGE) ×2 IMPLANT
TRAY FOLEY MTR SLVR 16FR STAT (SET/KITS/TRAYS/PACK) IMPLANT
TRAY FOLEY W/BAG SLVR 16FR (SET/KITS/TRAYS/PACK)
TRAY FOLEY W/BAG SLVR 16FR ST (SET/KITS/TRAYS/PACK) IMPLANT
VERSALIGHT (MISCELLANEOUS) ×2 IMPLANT
WATER STERILE IRR 1000ML POUR (IV SOLUTION) ×2 IMPLANT

## 2021-12-23 NOTE — Telephone Encounter (Signed)
I called surgery scheduling this morning and had to cancel the patient surgery due to not having singed consent form. The reason the consent was not obtained was because the patient had commercial insurance at the time the patient surgery was scheduled. Her  insurance show's inactive as of 11/25/21 and she didn't notify the office. Prior Auth was done at the time of scheduling the surgery which was 10/27/21.

## 2021-12-23 NOTE — Progress Notes (Signed)
Procedure canceled due to call from Provider's office coordinator re: insurance not approving procedure.  IV removed and patient discharged.

## 2022-01-01 ENCOUNTER — Encounter: Payer: 59 | Admitting: Obstetrics & Gynecology

## 2022-04-21 IMAGING — US US PELVIS COMPLETE WITH TRANSVAGINAL
1 series · 12 of 25 positions shown · non-contrast
Comparison: 10/04/2019
COMPARISON: 10/04/2019

Addendum:
CLINICAL DATA: Pelvic pain

EXAM:
TRANSABDOMINAL AND TRANSVAGINAL ULTRASOUND OF PELVIS
DOPPLER ULTRASOUND OF OVARIES
TECHNIQUE: Both transabdominal and transvaginal ultrasound examinations of the
pelvis were performed. Transabdominal technique was performed for
global imaging of the pelvis including uterus, ovaries, adnexal
regions, and pelvic cul-de-sac.
It was necessary to proceed with endovaginal exam following the
transabdominal exam to visualize the endometrium and ovaries. Color
and duplex Doppler ultrasound was utilized to evaluate blood flow to
the ovaries.

[Series 1: us pelvic complete with transvaginal · 143 acquisitions, 12 frames shown]
[im 6/143]
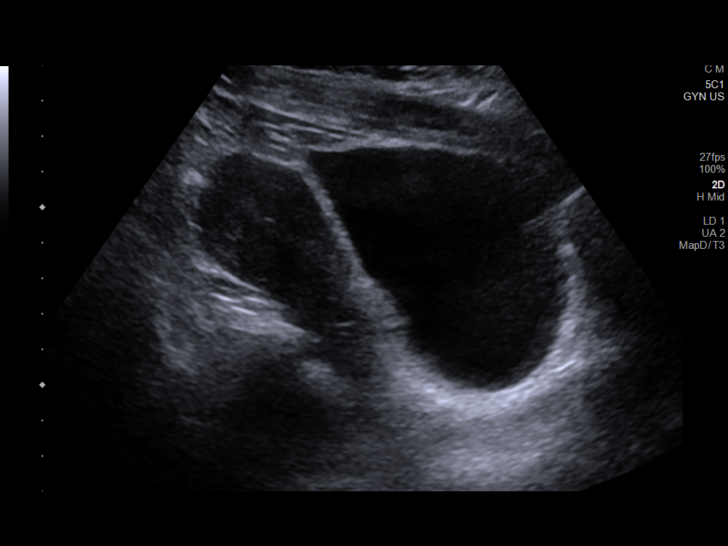
[im 18/143]
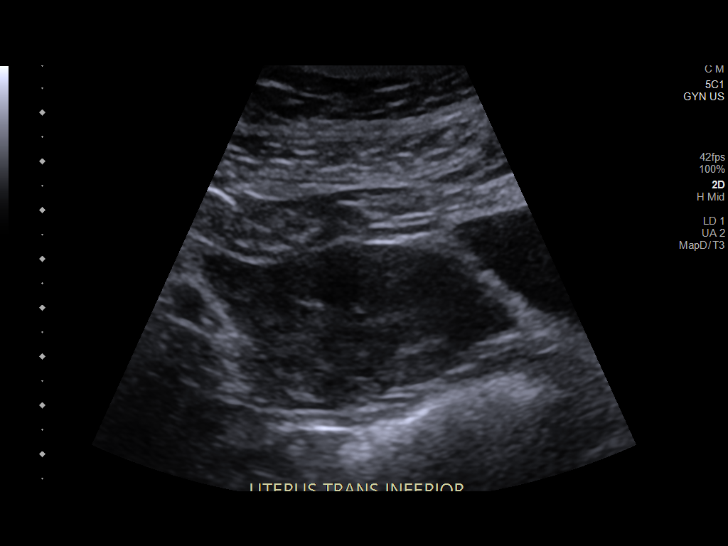
[im 30/143]
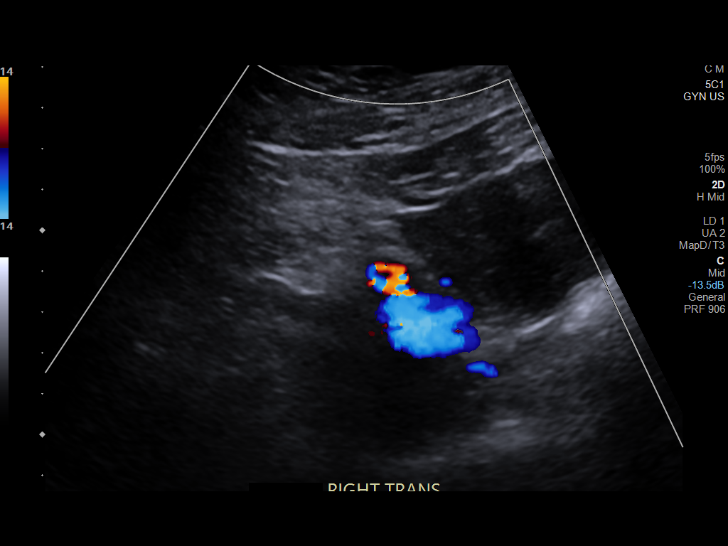
[im 42/143]
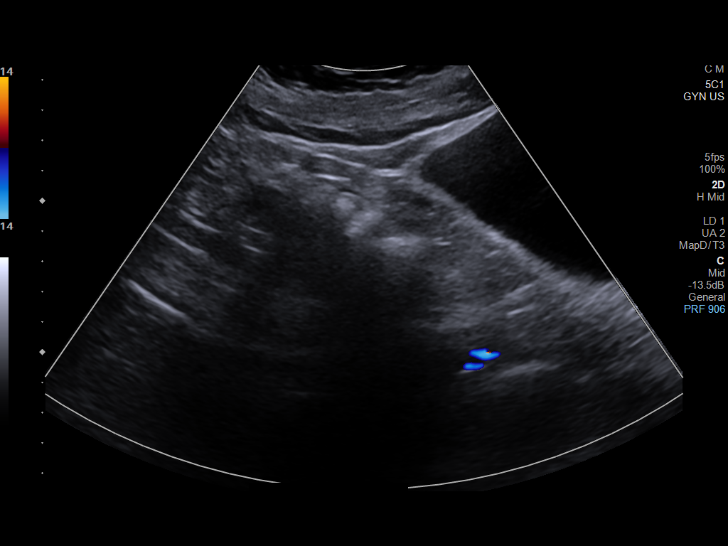
[im 54/143]
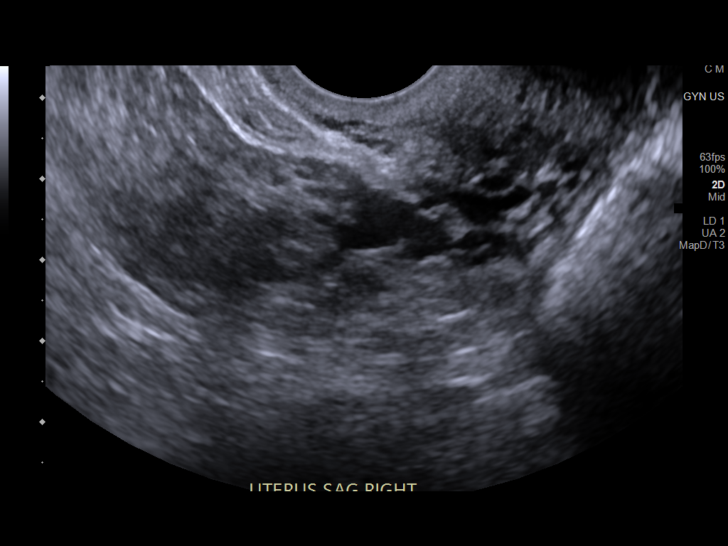
[im 66/143]
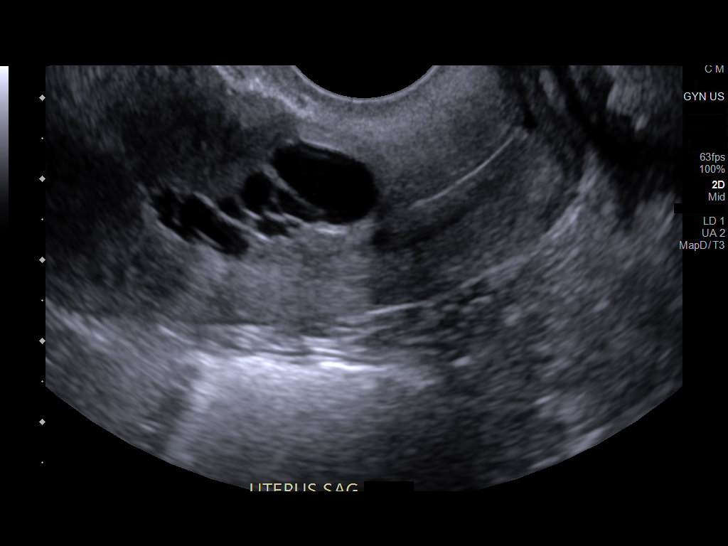
[im 77/143]
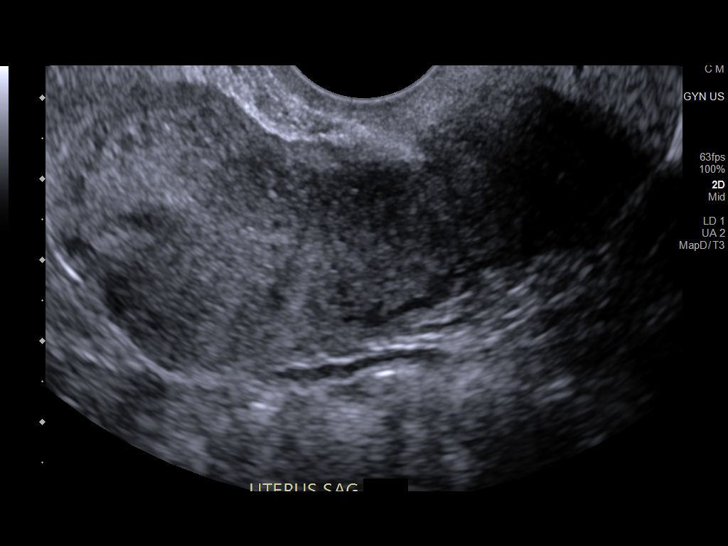
[im 89/143]
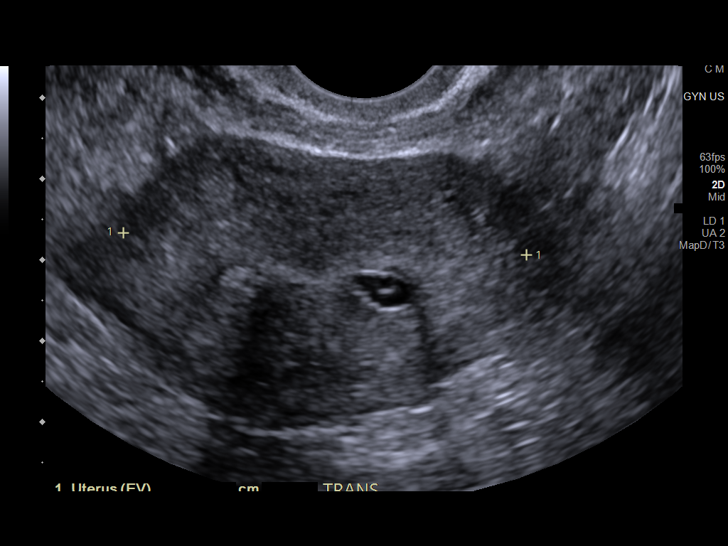
[im 101/143]
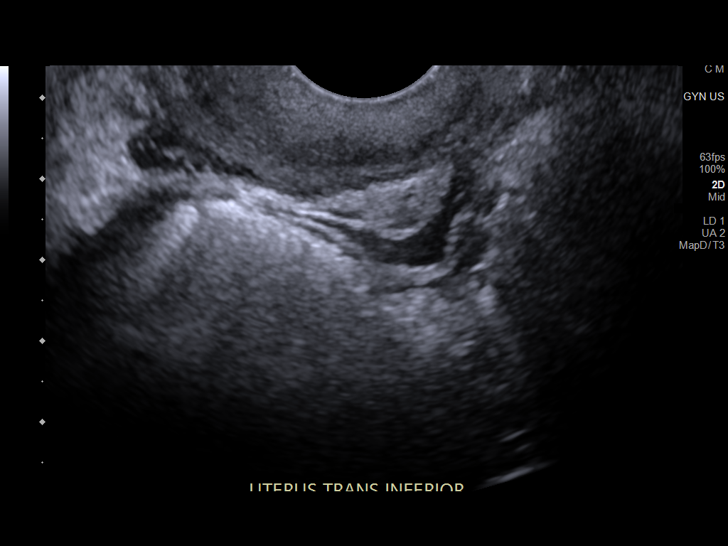
[im 113/143]
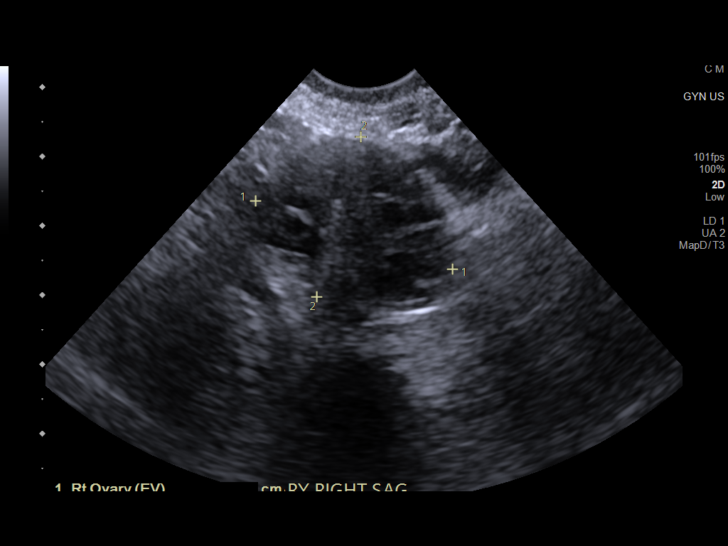
[im 125/143]
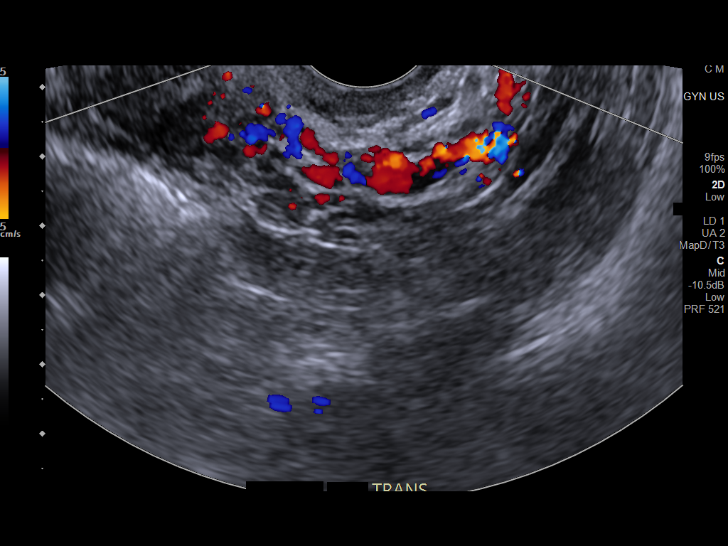
[im 137/143]
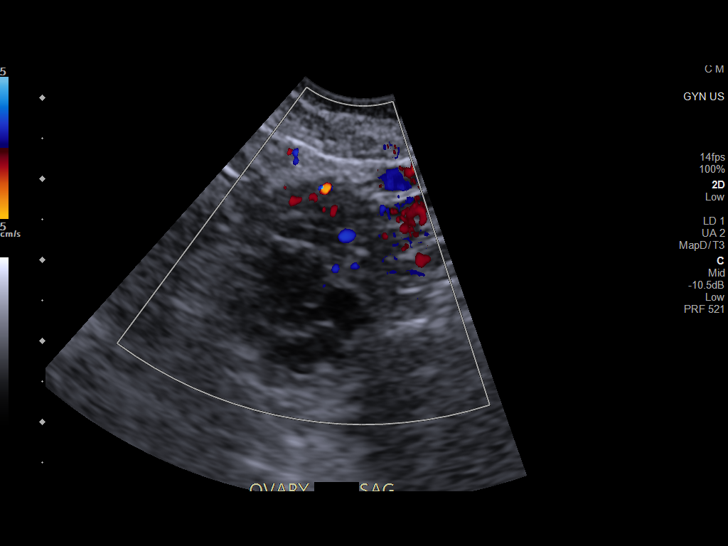

[12 of 25 positions shown; findings below may reference images not displayed]

FINDINGS: Uterus

Measurements: 7.4 x 3.5 x 5 cm = volume: 67.6 mL. There is
inhomogeneous echogenicity in myometrium.

Endometrium

Thickness: 5 mm in the fundus. There is fluid in the endometrial
cavity. There are few loculated cystic structures in the lower
uterine segment each measuring less than 1.5 cm this finding was not
evident in the previous examination. Cervix is closed.

Right ovary

Measurements: 3 x 2.4 x 2.3 cm = volume: 8.5 mL. Normal
appearance/no adnexal mass.

Left ovary

Measurements: 2.4 x 2.3 x 2.4 cm = volume: 6.9 mL. Normal
appearance/no adnexal mass.

Pulsed Doppler evaluation of both ovaries demonstrates normal
low-resistance arterial and venous waveforms.

Other findings

No abnormal free fluid.
IMPRESSION: There is small amount of fluid in the endometrial cavity. There are
new complex cystic structures in the central portion of lower
uterine segment. These may suggest endometrial cysts or residual
changes from previous endometrial ablation.

There are no adnexal masses. There is vascular flow in both adnexal
regions. There is no free fluid in the pelvis.

ADDENDUM:
This addendum is made to clarify Doppler technique used for the
study. Color flow Doppler examination was done. Pulsed Doppler
examination was not performed.

*** End of Addendum ***
FINDINGS: Uterus

Measurements: 7.4 x 3.5 x 5 cm = volume: 67.6 mL. There is
inhomogeneous echogenicity in myometrium.

Endometrium

Thickness: 5 mm in the fundus. There is fluid in the endometrial
cavity. There are few loculated cystic structures in the lower
uterine segment each measuring less than 1.5 cm this finding was not
evident in the previous examination. Cervix is closed.

Right ovary

Measurements: 3 x 2.4 x 2.3 cm = volume: 8.5 mL. Normal
appearance/no adnexal mass.

Left ovary

Measurements: 2.4 x 2.3 x 2.4 cm = volume: 6.9 mL. Normal
appearance/no adnexal mass.

Pulsed Doppler evaluation of both ovaries demonstrates normal
low-resistance arterial and venous waveforms.

Other findings

No abnormal free fluid.
IMPRESSION: There is small amount of fluid in the endometrial cavity. There are
new complex cystic structures in the central portion of lower
uterine segment. These may suggest endometrial cysts or residual
changes from previous endometrial ablation.

There are no adnexal masses. There is vascular flow in both adnexal
regions. There is no free fluid in the pelvis.
